# Patient Record
Sex: Male | Born: 1975 | Race: White | Hispanic: No | Smoking: Former smoker
Health system: Southern US, Community
[De-identification: ages and names within clinical notes are randomized; demographics above are authoritative.]

## PROBLEM LIST (undated history)

## (undated) DIAGNOSIS — M5430 Sciatica, unspecified side: Secondary | ICD-10-CM

## (undated) DIAGNOSIS — I10 Essential (primary) hypertension: Secondary | ICD-10-CM

## (undated) DIAGNOSIS — S0990XA Unspecified injury of head, initial encounter: Secondary | ICD-10-CM

## (undated) DIAGNOSIS — R569 Unspecified convulsions: Secondary | ICD-10-CM

## (undated) DIAGNOSIS — F4310 Post-traumatic stress disorder, unspecified: Secondary | ICD-10-CM

## (undated) DIAGNOSIS — G8929 Other chronic pain: Secondary | ICD-10-CM

## (undated) DIAGNOSIS — M549 Dorsalgia, unspecified: Secondary | ICD-10-CM

## (undated) DIAGNOSIS — J9811 Atelectasis: Secondary | ICD-10-CM

## (undated) DIAGNOSIS — F319 Bipolar disorder, unspecified: Secondary | ICD-10-CM

## (undated) DIAGNOSIS — R748 Abnormal levels of other serum enzymes: Secondary | ICD-10-CM

## (undated) DIAGNOSIS — F419 Anxiety disorder, unspecified: Secondary | ICD-10-CM

## (undated) HISTORY — PX: CHEST TUBE INSERTION: SHX231

## (undated) HISTORY — PX: KNEE SURGERY: SHX244

## (undated) HISTORY — PX: OTHER SURGICAL HISTORY: SHX169

## (undated) HISTORY — PX: BRAIN SURGERY: SHX531

---

## 1999-05-16 ENCOUNTER — Emergency Department (HOSPITAL_COMMUNITY): Admission: EM | Admit: 1999-05-16 | Discharge: 1999-05-16 | Payer: Self-pay | Admitting: Emergency Medicine

## 1999-11-16 ENCOUNTER — Emergency Department (HOSPITAL_COMMUNITY): Admission: EM | Admit: 1999-11-16 | Discharge: 1999-11-16 | Payer: Self-pay

## 2000-07-11 ENCOUNTER — Encounter: Admission: RE | Admit: 2000-07-11 | Discharge: 2000-10-06 | Payer: Self-pay | Admitting: Anesthesiology

## 2000-10-04 ENCOUNTER — Ambulatory Visit (HOSPITAL_COMMUNITY): Admission: RE | Admit: 2000-10-04 | Discharge: 2000-10-04 | Payer: Self-pay | Admitting: Neurosurgery

## 2000-10-04 ENCOUNTER — Encounter: Payer: Self-pay | Admitting: Neurosurgery

## 2000-11-15 ENCOUNTER — Encounter: Payer: Self-pay | Admitting: Emergency Medicine

## 2000-11-15 ENCOUNTER — Emergency Department (HOSPITAL_COMMUNITY): Admission: EM | Admit: 2000-11-15 | Discharge: 2000-11-15 | Payer: Self-pay | Admitting: Emergency Medicine

## 2001-11-02 ENCOUNTER — Emergency Department (HOSPITAL_COMMUNITY): Admission: EM | Admit: 2001-11-02 | Discharge: 2001-11-02 | Payer: Self-pay | Admitting: Emergency Medicine

## 2001-11-02 ENCOUNTER — Encounter: Payer: Self-pay | Admitting: Emergency Medicine

## 2003-07-19 ENCOUNTER — Inpatient Hospital Stay (HOSPITAL_COMMUNITY): Admission: EM | Admit: 2003-07-19 | Discharge: 2003-07-20 | Payer: Self-pay | Admitting: Emergency Medicine

## 2006-11-18 ENCOUNTER — Encounter: Payer: Self-pay | Admitting: Emergency Medicine

## 2006-11-18 ENCOUNTER — Inpatient Hospital Stay (HOSPITAL_COMMUNITY): Admission: EM | Admit: 2006-11-18 | Discharge: 2006-11-22 | Payer: Self-pay | Admitting: Emergency Medicine

## 2009-04-16 ENCOUNTER — Ambulatory Visit: Payer: Self-pay | Admitting: Thoracic Surgery

## 2009-04-16 ENCOUNTER — Inpatient Hospital Stay (HOSPITAL_COMMUNITY): Admission: EM | Admit: 2009-04-16 | Discharge: 2009-04-26 | Payer: Self-pay | Admitting: Thoracic Surgery

## 2009-04-16 ENCOUNTER — Ambulatory Visit: Payer: Self-pay | Admitting: Cardiology

## 2009-04-17 ENCOUNTER — Encounter: Payer: Self-pay | Admitting: Thoracic Surgery

## 2009-04-19 ENCOUNTER — Encounter: Payer: Self-pay | Admitting: Thoracic Surgery

## 2009-05-05 ENCOUNTER — Ambulatory Visit: Payer: Self-pay | Admitting: Thoracic Surgery

## 2009-06-23 ENCOUNTER — Ambulatory Visit: Payer: Self-pay | Admitting: Thoracic Surgery

## 2009-06-23 ENCOUNTER — Encounter: Admission: RE | Admit: 2009-06-23 | Discharge: 2009-06-23 | Payer: Self-pay | Admitting: Thoracic Surgery

## 2009-09-20 ENCOUNTER — Emergency Department (HOSPITAL_COMMUNITY): Admission: EM | Admit: 2009-09-20 | Discharge: 2009-09-20 | Payer: Self-pay | Admitting: Emergency Medicine

## 2009-09-28 ENCOUNTER — Ambulatory Visit: Payer: Self-pay | Admitting: Thoracic Surgery

## 2009-09-28 ENCOUNTER — Encounter: Admission: RE | Admit: 2009-09-28 | Discharge: 2009-09-28 | Payer: Self-pay | Admitting: Thoracic Surgery

## 2009-11-17 ENCOUNTER — Ambulatory Visit: Payer: Self-pay | Admitting: Thoracic Surgery

## 2010-02-27 ENCOUNTER — Encounter: Payer: Self-pay | Admitting: Thoracic Surgery

## 2010-05-01 LAB — URINALYSIS, ROUTINE W REFLEX MICROSCOPIC
Hgb urine dipstick: NEGATIVE
Nitrite: NEGATIVE
Protein, ur: NEGATIVE mg/dL
Specific Gravity, Urine: 1.011 (ref 1.005–1.030)
Urobilinogen, UA: 1 mg/dL (ref 0.0–1.0)

## 2010-05-01 LAB — COMPREHENSIVE METABOLIC PANEL
ALT: 21 U/L (ref 0–53)
ALT: 95 U/L — ABNORMAL HIGH (ref 0–53)
AST: 13 U/L (ref 0–37)
Albumin: 3.2 g/dL — ABNORMAL LOW (ref 3.5–5.2)
Alkaline Phosphatase: 102 U/L (ref 39–117)
Alkaline Phosphatase: 197 U/L — ABNORMAL HIGH (ref 39–117)
BUN: 3 mg/dL — ABNORMAL LOW (ref 6–23)
CO2: 27 mEq/L (ref 19–32)
CO2: 29 mEq/L (ref 19–32)
Calcium: 7.9 mg/dL — ABNORMAL LOW (ref 8.4–10.5)
Calcium: 8.8 mg/dL (ref 8.4–10.5)
Chloride: 100 mEq/L (ref 96–112)
Chloride: 101 mEq/L (ref 96–112)
Chloride: 102 mEq/L (ref 96–112)
Creatinine, Ser: 0.59 mg/dL (ref 0.4–1.5)
GFR calc non Af Amer: >60 mL/min (ref >60)
GFR calc non Af Amer: >60 mL/min (ref >60)
Glucose, Bld: 116 mg/dL — ABNORMAL HIGH (ref 70–99)
Glucose, Bld: 93 mg/dL (ref 70–99)
Potassium: 4.2 mEq/L (ref 3.5–5.1)
Sodium: 133 mEq/L — ABNORMAL LOW (ref 135–145)
Sodium: 136 mEq/L (ref 135–145)
Total Bilirubin: 0.5 mg/dL (ref 0.3–1.2)
Total Bilirubin: 0.5 mg/dL (ref 0.3–1.2)
Total Protein: 5.8 g/dL — ABNORMAL LOW (ref 6.0–8.3)

## 2010-05-01 LAB — CULTURE, BLOOD (ROUTINE X 2): Culture: NO GROWTH

## 2010-05-01 LAB — BASIC METABOLIC PANEL
BUN: 3 mg/dL — ABNORMAL LOW (ref 6–23)
CO2: 27 mEq/L (ref 19–32)
CO2: 28 mEq/L (ref 19–32)
Calcium: 7.9 mg/dL — ABNORMAL LOW (ref 8.4–10.5)
Calcium: 8.3 mg/dL — ABNORMAL LOW (ref 8.4–10.5)
Creatinine, Ser: 0.53 mg/dL (ref 0.4–1.5)
GFR calc Af Amer: >60 mL/min (ref >60)
GFR calc Af Amer: >60 mL/min (ref >60)
Glucose, Bld: 142 mg/dL — ABNORMAL HIGH (ref 70–99)
Potassium: 3.4 mEq/L — ABNORMAL LOW (ref 3.5–5.1)
Sodium: 133 mEq/L — ABNORMAL LOW (ref 135–145)

## 2010-05-01 LAB — FUNGUS CULTURE W SMEAR: Fungal Smear: NONE SEEN

## 2010-05-01 LAB — CULTURE, RESPIRATORY W GRAM STAIN: Culture: NORMAL

## 2010-05-01 LAB — CBC
HCT: 28.1 % — ABNORMAL LOW (ref 39.0–52.0)
Hemoglobin: 10 g/dL — ABNORMAL LOW (ref 13.0–17.0)
MCHC: 33.1 g/dL (ref 30.0–36.0)
MCHC: 33.2 g/dL (ref 30.0–36.0)
MCHC: 33.3 g/dL (ref 30.0–36.0)
MCHC: 33.5 g/dL (ref 30.0–36.0)
MCV: 84.2 fL (ref 78.0–100.0)
MCV: 84.6 fL (ref 78.0–100.0)
MCV: 84.7 fL (ref 78.0–100.0)
MCV: 85.8 fL (ref 78.0–100.0)
Platelets: 212 10*3/uL (ref 150–400)
Platelets: 238 10*3/uL (ref 150–400)
RBC: 3.32 MIL/uL — ABNORMAL LOW (ref 4.22–5.81)
RBC: 3.58 MIL/uL — ABNORMAL LOW (ref 4.22–5.81)
RBC: 3.96 MIL/uL — ABNORMAL LOW (ref 4.22–5.81)
RBC: 4 MIL/uL — ABNORMAL LOW (ref 4.22–5.81)
RBC: 4.38 MIL/uL (ref 4.22–5.81)
RBC: 4.45 MIL/uL (ref 4.22–5.81)
RDW: 14.4 % (ref 11.5–15.5)
WBC: 10.1 10*3/uL (ref 4.0–10.5)
WBC: 13.9 10*3/uL — ABNORMAL HIGH (ref 4.0–10.5)
WBC: 14 10*3/uL — ABNORMAL HIGH (ref 4.0–10.5)
WBC: 16.4 10*3/uL — ABNORMAL HIGH (ref 4.0–10.5)

## 2010-05-01 LAB — BLOOD GAS, ARTERIAL
O2 Content: 2 L/min
Patient temperature: 98.6
pO2, Arterial: 72.2 mmHg — ABNORMAL LOW (ref 80.0–100.0)

## 2010-05-01 LAB — DIFFERENTIAL
Basophils Relative: 0 % (ref 0–1)
Eosinophils Absolute: 0.1 10*3/uL (ref 0.0–0.7)
Lymphocytes Relative: 6 % — ABNORMAL LOW (ref 12–46)
Monocytes Relative: 9 % (ref 3–12)
Neutro Abs: 15.2 10*3/uL — ABNORMAL HIGH (ref 1.7–7.7)
Neutrophils Relative %: 84 % — ABNORMAL HIGH (ref 43–77)

## 2010-05-01 LAB — AFB CULTURE WITH SMEAR (NOT AT ARMC): Acid Fast Smear: NONE SEEN

## 2010-05-01 LAB — TISSUE CULTURE: Culture: NO GROWTH

## 2010-05-01 LAB — ABO/RH: ABO/RH(D): O POS

## 2010-05-01 LAB — EXPECTORATED SPUTUM ASSESSMENT W GRAM STAIN, RFLX TO RESP C

## 2010-05-01 LAB — CROSSMATCH
ABO/RH(D): O POS
Antibody Screen: NEGATIVE

## 2010-05-01 LAB — APTT: aPTT: 41 seconds — ABNORMAL HIGH (ref 24–37)

## 2010-05-01 LAB — MRSA PCR SCREENING: MRSA by PCR: NEGATIVE

## 2010-05-01 LAB — PROTIME-INR
INR: 1.32 (ref 0.00–1.49)
Prothrombin Time: 16.3 seconds — ABNORMAL HIGH (ref 11.6–15.2)

## 2010-06-21 NOTE — Op Note (Signed)
NAME:  Justin [name], Justin [name]                ACCOUNT NO.:  [id number]   MEDICAL RECORD NO.:  [id number]          PATIENT TYPE:  INP   LOCATION:  3111                         FACILITY:  [facility]   PHYSICIAN:  Payton Doughty, M.D.      DATE OF BIRTH:  [birth year]   DATE OF PROCEDURE:  11/18/2006  DATE OF DISCHARGE:                               OPERATIVE REPORT   PREOPERATIVE DIAGNOSIS:  Epidural hematoma.   POSTOPERATIVE DIAGNOSIS:  Epidural hematoma.   OPERATIVE PROCEDURE:  Left temporal craniectomy for epidural hematoma  evacuation.   SURGEON:  Payton Doughty, M.D.   ANESTHESIA:  General endotracheal.   PREP:  Sterile Betadine prep and scrub with alcohol wipe.   COMPLICATIONS:  None.   ASSISTANT:  None.   BODY OF TEXT:  This is a 35 year old gentleman with left temporal  epidural.  Taken to operative smoothly anesthetized, intubated, placed  supine on the operating table with the left shoulder bumped up.  The  head turned towards the right on a donut.  Following shave, prep, draped  in usual sterile fashion, skin was infiltrated with 1% lidocaine with  1:400,000 epinephrine.  The skin was incised starting just at the top of  the zygoma posteriorly near the ear, straight superiorly.  Shallow  incision was made and the fascia was carefully dissected spreading it to  not cut across it to preserve the branches of the seventh nerve should  they be there.  The temporalis fascia was carefully identified and  incised the Bovie was used to cut down to the skull and then out onto  the zygoma anteriorly.  Incision was swept up over the temporalis to  allow more anterior access to where the hematoma was.  The Weitlaner  retractor were placed.  A single bur hole was created and it was  enlarged using the Leksell rongeurs and then a small approximately 2 x 2  cm bone flap was turned with the craniotome.  The total size of the  craniectomy was probably 3 x 4 cm.  This allowed evacuation of the  hematoma  and access down to where two bleeding points were discovered  and bipolared.  To prevent reaccumulation, no bone was replaced.  A JP  drain was placed in the epidural space and exited via separate  incision..  Successive layers of 2-0 Vicryl, 3-0 nylon were used to  close the temporalis fascia galea and skin respectively.  Betadine Telfa  dressing was applied, made occlusive with OpSite and the patient  returned recovery room in good condition.           ______________________________  Payton Doughty, M.D.     MWR/MEDQ  D:  11/18/2006  T:  11/19/2006  Job:  [id number]

## 2010-06-21 NOTE — Assessment & Plan Note (Signed)
OFFICE VISIT   [place], [place] O  DOB:  [birth year]                                        Jun 23, 2009  CHART #:  [id number]   He returns today.  His chest x-ray continues to improve with improved  aeration.  There is no recurrence of either his pneumothorax or his  empyema.  He is still having some moderate chest wall pain but hopefully  this will improve.  He asked for [place] to five him a refill for his  antianxiety medication, but I told him to check with his psychiatrist on  that.  I will see him back again in 2 months for final check with a  chest x-ray.   Ines Bloomer, M.D.  Electronically Signed   DPB/MEDQ  D:  06/23/2009  T:  06/24/2009  Job:  [id number]
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
## 2010-06-21 NOTE — Letter (Signed)
May 05, 2009   Terrilee Files, MD  [address]  [place], [place]  [address]   Re:  [place], [place] O                DOB:  1975/05/09   Dear Dr. Georgiana Shore:   The patient returned today.  His blood pressure was 121/79, pulse 72,  respirations 18, and sats were 99%.  His chest x-ray that was sent with  him showed some mild blunting in the left costophrenic angle, but  overall he is doing well.  The patient had an empyema that probably was  secondary to a ruptured lung abscess and could cause his pneumothorax.  We did a VATS drainage and decortication of this and he has done well  since then.  He has finished his antibiotics.  I will see him back again  in 6 weeks with a chest x-ray.   Ines Bloomer, M.D.  Electronically Signed   DPB/MEDQ  D:  05/05/2009  T:  05/06/2009  Job:  [id number]
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
## 2010-06-21 NOTE — Assessment & Plan Note (Signed)
OFFICE VISIT   [place], [place] O  DOB:  1975-10-13                                        September 28, 2009  CHART #:  [id number]   The patient came today, and his chest x-ray is stable with some  parenchymal changes.  He still has some mild anterior chest wall pain in  the morning on awakening.  His blood pressure is 138/85, pulse 70,  respirations 18, sats are 99%.  Lungs are clear to auscultation and  percussion.  I will release him back to full activity and will have to  see him again if he has any future problems.   Ines Bloomer, M.D.  Electronically Signed   DPB/MEDQ  D:  09/28/2009  T:  09/29/2009  Job:  [id number]

## 2010-06-24 NOTE — H&P (Signed)
[facility]  Patient:    [name], [name]                       MRN: [id number] Adm. Date:  [id number] Attending:  Thyra Breed CC:         Tanya Nones. Jeral Fruit, M.D.   History and Physical  FOLLOWUP EVALUATION:  Justin comes in for followup evaluation of his low back pain radiating out into his left lower extremity.  Since his last evaluation, he has noted that going up on OxyContin to 40 mg twice a day has taken the edge off of his pain and he rates it at 4/10.  He is requiring about two Percocet per day while he is on this.  He has not been back to see Dr. Tanya Nones. Botero, as apparently he was advised that he did not get his Cobra in quick enough.  He has an attorney who is looking to try and get all this on workers compensation.  He denies any new neurologic symptoms.  He does note that the medications are helping but walking, bending, sitting or lifting will increase his pain.  It continues to radiate out into the left lower extremity.  CURRENT MEDICATIONS: 1. OxyContin 40 mg one twice a day. 2. Percocet 5/325 mg one to two p.o. q.6h. p.r.n.  EXAMINATION:  VITAL SIGNS:  Blood pressure 131/99.  Heart rate is 120.  Respiratory rate is 16.  O2 saturation is 100%.  Pain level is 4/10.  NEUROLOGIC:  Straight leg raise signs are positive on the left side at 40 degrees.  Deep tendon reflexes were symmetric at the knees and ankles.  IMPRESSION:  Lumbar degenerative disk disease with herniated disk at L5-L6.  DISPOSITION: 1. Continue on OxyContin 40 mg one p.o. b.i.d. 2. Percocet 5/325 mg 1 p.o. q.6h. p.r.n., #60. 3. Continue with exercises from ______ book. 4. The patient was encouraged to go ahead and schedule in with Dr. Jeral Fruit. DD:  08/23/00 TD:  08/23/00 Job: [id number]
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
## 2010-06-24 NOTE — H&P (Signed)
[facility]  Patient:    [name], [name]                       MRN: [id number] Adm. Date:  [id number] Attending:  Thyra Breed CC:         Tanya Nones. Jeral Fruit, M.D.   History and Physical  FOLLOWUP EVALUATION:  Justin comes in for followup evaluation of his low back pain on the basis of lumbar degenerative disk disease with herniation at L5 transitional vertebra level.  He has received two lumbar epidural steroid injections and has not noted a significant reduction in his pain.  I feel that he is likely unresponsive to epidural steroid injections.  He has noted that he is developing a cold and coughing more and as a result, he is having increasing back discomfort radiating out into the left lower extremity to the lateral aspect of his left foot.  He is noting that the OxyContin 20 mg twice a day is just not quite holding him as long as it should and he is taking about four tablets of Percocet per day to try and supplement this.  He is taking a laxative.  He is not actively in physical therapy and he was told by Dr. Tanya Nones. Botero to hold off on that for the time-being.  I have encouraged him to try and get a little increased level of activity, just so he does not get so severely deconditioned and I have given him a copy of a book list of ______ publications.  PHYSICAL EXAMINATION:  VITAL SIGNS:  Blood pressure 124/70.  Heart rate 67.  Respiratory rate is 18. O2 saturation is 100%.  Pain level is 6/10.  BACK:  The patient exhibited positive straight leg raise sign on the left side at 30 degrees.  EXTREMITIES:  Deep tendon reflexes were symmetric at the knee and ankles.  IMPRESSION:  Lumbar degenerative disk disease with herniated disk at L5-L6.  DISPOSITION: 1. Will increase OxyContin to 20 mg 1 p.o. q.8h., #90 with no refill. 2. Percocet 1 to 2 p.o. q.6h. p.r.n., #60, for breakthrough pain only. 3. He was given a list of ______ books and  encouraged to consider starting    some light exercises just to keep his back conditioned. 4. He plans to follow up with Dr. Jeral Fruit to see whether there are any surgical    options.  I plan to see him back in followup in four weeks if he is not a    surgical candidate. DD:  07/26/00 TD:  07/26/00 Job: [id number]

## 2010-06-24 NOTE — H&P (Signed)
[facility]  Patient:    Justin [name], Justin [name]                       MRN: [id number] Adm. Date:  [id number] Attending:  Thyra Breed CC:         Tanya Nones. Jeral Fruit, M.D.   History and Physical  FOLLOW-UP EVALUATION:  Justin comes in for follow-up evaluation of his chronic low back pain with underlying lumbar herniated disk. Since his last evaluation, he has been under a lot of stress. He has been evicted from his apartment, lost his motor vehicle, and is now living with his mother with his current wife. Apparently, there is a considerable amount of stress there. He continues to have pain and does feel overall improvement with the OxyContin but feels like it only lasts about 8 hours. He has to take at least four Percocet per day. He notes that sitting, standing, walking, and bending for prolonged periods of time will increase his discomfort. He is improved with physical therapy at home and the medicine partially.  PHYSICAL EXAMINATION:  VITAL SIGNS:  Blood pressure is 130/84, heart rate is 132, respiratory rate is 16, O2 saturation is 100%, pain level is 7/10.  NEUROLOGICAL:  The patient is anxious. Deep tendon reflexes were symmetric in the lower extremities with positive straight leg raise sign on the left side at 60 degrees. He has no change in his sensory exam from previously.  IMPRESSION: 1. Low back pain syndrome with known underlying lumbar herniated disk with    onset of discomfort dated from work-related injury. 2. Anxiety.  DISPOSITION: 1. Continue OxyContin 40 mg one p.[name]. b.i.d. for the time being. We will go    ahead and see if he qualifies for [employer] patient assistance    program. If he does, we will go ahead and increase this to 40 mg one p.[name].    q.8h. I have written a prescription for 90 tablets of this to be forwarded    to them. 2. Xanax 1 mg one p.[name]. q.8h. p.r.n., #90 with no refill. 3. Follow up with me in four weeks. 4. The  patient plans to see Tanya Nones. Jeral Fruit, M.D. in the interim. DD:  09/14/00 TD:  09/15/00 Job: [id number]

## 2010-06-24 NOTE — Op Note (Signed)
NAME:  Justin [name], Justin [name]                          ACCOUNT NO.:  [id number]   MEDICAL RECORD NO.:  [id number]                   PATIENT TYPE:  INP   LOCATION:  0154                                 FACILITY:  [facility]   PHYSICIAN:  Georgiana Spinner, M.D.                 DATE OF BIRTH:  [birth year]   DATE OF PROCEDURE:  07/19/2003  DATE OF DISCHARGE:                                 OPERATIVE REPORT   PROCEDURE:  Upper endoscopy.   INDICATIONS:  Evaluation of toxic ingestion.   ANESTHESIA:  1. Demerol 100 mg.  2. Versed 10 mg.   DESCRIPTION OF PROCEDURE:  With patient mildly sedated in the left lateral  decubitus position, the Olympus videoscopic endoscope was inserted in the  mouth, passed under direct vision through the esophagus, which appeared  normal.  We entered into the stomach and immediately noted that the stomach  was quite edematous, erythematous, and multiple erosions and ulcerations  with black excrescences were seen in the stomach.  We were able to advance  through the pylorus into the duodenal bulb which appeared normal as did  second portion of duodenum.  From this point, the endoscope was then slowly  withdrawn, taking circumferential views of duodenal mucosa until the  endoscope then pulled back into the stomach, placed in retroflexion to view  the stomach from below.  The endoscope was then straightened and withdrawn,  taking circumferential views of the remaining gastric and esophageal mucosa.  We pulled out through the pharynx and got a look at the vocal cords and the  arytenoid area, and all of this appeared normal.  The patient's vital signs  and pulse oximeter remained stable.  The patient tolerated the procedure  well without apparent complications.   FINDINGS:  Hemorrhagic gastritis moderately to markedly severe.  Otherwise,  unremarkable with sparing of esophagus, duodenum, and hypopharynx.   PLAN:  Will start patient on clear liquids, start proton pump inhibitor  IV,  and follow up clinical course.                                               Georgiana Spinner, M.D.    GMO/MEDQ  D:  07/19/2003  T:  07/19/2003  Job:  [id number]

## 2010-06-24 NOTE — Procedures (Signed)
[facility]  Patient:    Justin [name]                       MRN: [id number] Proc. Date: 07/19/00 Adm. Date:  [id number] Attending:  Thyra Breed CC:         Tanya Nones. Jeral Fruit, M.D.   Procedure Report  PROCEDURE:  Lumbar epidural steroid injection.  DIAGNOSIS:  Degenerative disk disease with herniation at L5 transitional vertebra level.  INTERVAL HISTORY:  The patient has noted minimal improvement after the first injection.  He is noting that the OxyContin is not quite holding him as long as he had hoped.  He continues with the OxyContin 20 mg twice a day plus the Percocet for rescue pain.  PHYSICAL EXAMINATION:  Blood pressure 137/59, heart rate 64, respiratory rate 16, O2 saturations 99%.  Pain level is 6/10.  His back shows good healing from his previous injection site.  DESCRIPTION OF PROCEDURE:  After informed consent, the patient was placed in the sitting position and monitored.  His back was prepped with Betadine x 3. A skin wheal was raised at the L4-L5 interspace with 1% lidocaine.  A 20 gauge Tuohy needle was introduced to the lumbar epidural space to loss of resistance to preservative-free normal saline.  There was no CSF nor blood.  The depth was 4 cm.  Medrol 80 mg in 8 mL of preservative-free normal saline was gently injected.  The needle was flushed with preservative-free normal saline and removed intact.  Postprocedure condition - stable.  DISCHARGE INSTRUCTIONS: 1. Resume previous diet. 2. Limitations and activities per instruction sheet. 3. Continue on current medications. 4. Follow up with me in 1-2 weeks for either repeat injection or adjustments    in his medications. DD:  07/19/00 TD:  07/19/00 Job: [id number]

## 2010-11-17 LAB — COMPREHENSIVE METABOLIC PANEL
BUN: 14
CO2: 25
Chloride: 101
Creatinine, Ser: 0.96
GFR calc non Af Amer: >60
Total Bilirubin: 1.3 — ABNORMAL HIGH

## 2010-11-17 LAB — I-STAT EC8
Acid-base deficit: 3 — ABNORMAL HIGH
BUN: 11
Chloride: 104
Glucose, Bld: 89
Potassium: 3.9
pCO2 arterial: 36.6
pH, Arterial: 7.382

## 2010-11-17 LAB — BASIC METABOLIC PANEL
Calcium: 8.2 — ABNORMAL LOW
Chloride: 101
Creatinine, Ser: 0.84
GFR calc Af Amer: >60
Sodium: 132 — ABNORMAL LOW

## 2010-11-17 LAB — CBC
HCT: 37.3 — ABNORMAL LOW
Hemoglobin: 11.9 — ABNORMAL LOW
MCV: 82.4
MCV: 84.5
Platelets: 231
RBC: 4.53
RBC: 4.27
WBC: 14 — ABNORMAL HIGH
WBC: 12.2 — ABNORMAL HIGH

## 2010-11-17 LAB — DIFFERENTIAL
Basophils Relative: 1
Eosinophils Absolute: 0.1
Eosinophils Relative: 1
Lymphocytes Relative: 22
Monocytes Absolute: 1.1 — ABNORMAL HIGH
Neutro Abs: 9.5 — ABNORMAL HIGH
Neutrophils Relative %: 68

## 2010-11-17 LAB — SAMPLE TO BLOOD BANK

## 2011-01-23 DIAGNOSIS — Z0279 Encounter for issue of other medical certificate: Secondary | ICD-10-CM

## 2011-02-03 ENCOUNTER — Emergency Department (HOSPITAL_COMMUNITY)
Admission: EM | Admit: 2011-02-03 | Discharge: 2011-02-04 | Disposition: A | Discharge status: Home / Self Care | Payer: No Typology Code available for payment source | Attending: Emergency Medicine | Admitting: Emergency Medicine

## 2011-02-03 ENCOUNTER — Emergency Department (HOSPITAL_COMMUNITY): Payer: Self-pay

## 2011-02-03 ENCOUNTER — Encounter: Payer: Self-pay | Admitting: *Deleted

## 2011-02-03 DIAGNOSIS — S0003XA Contusion of scalp, initial encounter: Secondary | ICD-10-CM | POA: Insufficient documentation

## 2011-02-03 DIAGNOSIS — S0180XA Unspecified open wound of other part of head, initial encounter: Secondary | ICD-10-CM | POA: Insufficient documentation

## 2011-02-03 DIAGNOSIS — S21219A Laceration without foreign body of unspecified back wall of thorax without penetration into thoracic cavity, initial encounter: Secondary | ICD-10-CM

## 2011-02-03 DIAGNOSIS — M25559 Pain in unspecified hip: Secondary | ICD-10-CM | POA: Insufficient documentation

## 2011-02-03 DIAGNOSIS — T07XXXA Unspecified multiple injuries, initial encounter: Secondary | ICD-10-CM

## 2011-02-03 DIAGNOSIS — S0181XA Laceration without foreign body of other part of head, initial encounter: Secondary | ICD-10-CM

## 2011-02-03 DIAGNOSIS — M545 Low back pain, unspecified: Secondary | ICD-10-CM | POA: Insufficient documentation

## 2011-02-03 DIAGNOSIS — R51 Headache: Secondary | ICD-10-CM | POA: Insufficient documentation

## 2011-02-03 DIAGNOSIS — S21209A Unspecified open wound of unspecified back wall of thorax without penetration into thoracic cavity, initial encounter: Secondary | ICD-10-CM | POA: Insufficient documentation

## 2011-02-03 DIAGNOSIS — Z79899 Other long term (current) drug therapy: Secondary | ICD-10-CM | POA: Insufficient documentation

## 2011-02-03 DIAGNOSIS — S41009A Unspecified open wound of unspecified shoulder, initial encounter: Secondary | ICD-10-CM | POA: Insufficient documentation

## 2011-02-03 DIAGNOSIS — S0083XA Contusion of other part of head, initial encounter: Secondary | ICD-10-CM | POA: Insufficient documentation

## 2011-02-03 DIAGNOSIS — S01119A Laceration without foreign body of unspecified eyelid and periocular area, initial encounter: Secondary | ICD-10-CM | POA: Insufficient documentation

## 2011-02-03 DIAGNOSIS — F411 Generalized anxiety disorder: Secondary | ICD-10-CM | POA: Insufficient documentation

## 2011-02-03 DIAGNOSIS — IMO0002 Reserved for concepts with insufficient information to code with codable children: Secondary | ICD-10-CM | POA: Insufficient documentation

## 2011-02-03 HISTORY — DX: Unspecified convulsions: R56.9

## 2011-02-03 HISTORY — DX: Dorsalgia, unspecified: M54.9

## 2011-02-03 HISTORY — DX: Other chronic pain: G89.29

## 2011-02-03 HISTORY — DX: Anxiety disorder, unspecified: F41.9

## 2011-02-03 LAB — POCT I-STAT, CHEM 8
BUN: 9 mg/dL (ref 6–23)
Creatinine, Ser: 1 mg/dL (ref 0.50–1.35)
Glucose, Bld: 121 mg/dL — ABNORMAL HIGH (ref 70–99)
Potassium: 3.8 mEq/L (ref 3.5–5.1)
Sodium: 137 mEq/L (ref 135–145)

## 2011-02-03 MED ORDER — HYDROMORPHONE HCL PF 1 MG/ML IJ SOLN
1.0000 mg | Freq: Once | INTRAMUSCULAR | Status: AC
  Administered 2011-02-03: 1 mg via INTRAVENOUS
  Filled 2011-02-03: qty 1

## 2011-02-03 MED ORDER — IOHEXOL 300 MG/ML  SOLN
100.0000 mL | Freq: Once | INTRAMUSCULAR | Status: AC | PRN
  Administered 2011-02-03: 100 mL via INTRAVENOUS

## 2011-02-03 MED ORDER — OXYCODONE-ACETAMINOPHEN 5-325 MG PO TABS
2.0000 | ORAL_TABLET | Freq: Once | ORAL | Status: AC
  Administered 2011-02-03: 2 via ORAL
  Filled 2011-02-03: qty 2

## 2011-02-03 MED ORDER — FENTANYL CITRATE 0.05 MG/ML IJ SOLN
INTRAMUSCULAR | Status: AC
  Administered 2011-02-03: 19:00:00
  Filled 2011-02-03: qty 2

## 2011-02-03 MED ORDER — TETANUS-DIPHTH-ACELL PERTUSSIS 5-2.5-18.5 LF-MCG/0.5 IM SUSP
0.5000 mL | Freq: Once | INTRAMUSCULAR | Status: AC
  Administered 2011-02-03: 0.5 mL via INTRAMUSCULAR
  Filled 2011-02-03: qty 0.5

## 2011-02-03 NOTE — ED Notes (Signed)
Hourly Rounding: Pt in CT at this time.

## 2011-02-03 NOTE — ED Notes (Signed)
Suture cart in room  

## 2011-02-03 NOTE — ED Notes (Signed)
Hourly Rounding: Pts pain addressed with pain medication. Pt sat up in bed, per EDP giving the okay. Water was given with pain medication. Family sitting with pt. Pt does not need the restroom at this time. Will continue to monitor.

## 2011-02-03 NOTE — ED Notes (Signed)
Hourly Rounding: Pt sleeping at this time. Family at bedside.
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
## 2011-02-03 NOTE — ED Notes (Signed)
Hourly Rounding: Pt sleeping at this time. Family at bedside. Will continue to monitor.
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
## 2011-02-04 MED ORDER — OXYCODONE HCL 5 MG PO TABS: 5.0000 mg | ORAL_TABLET | ORAL | Status: AC | PRN

## 2011-02-04 MED ORDER — HYDROMORPHONE HCL PF 1 MG/ML IJ SOLN
1.0000 mg | Freq: Once | INTRAMUSCULAR | Status: AC
  Administered 2011-02-04: 1 mg via INTRAVENOUS
  Filled 2011-02-04: qty 1

## 2011-02-04 NOTE — ED Provider Notes (Signed)
I saw and evaluated the patient, reviewed the resident's note and I agree with the findings and plan.   .Face to face Exam:  General:  Awake HEENT:  Facial laceration/abrasions Resp:  Normal effort Abd:  Nondistended Neuro:No focal weakness Lymph: No adenopathy   Nelia Shi, MD 02/04/11 1105

## 2011-02-04 NOTE — ED Notes (Signed)
Resident at bedside to suture.

## 2011-02-04 NOTE — ED Notes (Signed)
Pt d/c home with family member.  Rx x 1.  Pt voiced understanding to f/u with eye doctor tomorrow.

## 2011-04-05 ENCOUNTER — Other Ambulatory Visit: Payer: Self-pay

## 2011-04-05 ENCOUNTER — Emergency Department (HOSPITAL_COMMUNITY)
Admission: EM | Admit: 2011-04-05 | Discharge: 2011-04-05 | Disposition: A | Discharge status: Home / Self Care | Payer: Self-pay | Attending: Emergency Medicine | Admitting: Emergency Medicine

## 2011-04-05 ENCOUNTER — Encounter (HOSPITAL_COMMUNITY): Payer: Self-pay | Admitting: *Deleted

## 2011-04-05 ENCOUNTER — Emergency Department (HOSPITAL_COMMUNITY): Payer: Self-pay

## 2011-04-05 DIAGNOSIS — M549 Dorsalgia, unspecified: Secondary | ICD-10-CM | POA: Insufficient documentation

## 2011-04-05 DIAGNOSIS — F411 Generalized anxiety disorder: Secondary | ICD-10-CM | POA: Insufficient documentation

## 2011-04-05 DIAGNOSIS — R209 Unspecified disturbances of skin sensation: Secondary | ICD-10-CM | POA: Insufficient documentation

## 2011-04-05 DIAGNOSIS — R55 Syncope and collapse: Secondary | ICD-10-CM | POA: Insufficient documentation

## 2011-04-05 DIAGNOSIS — R569 Unspecified convulsions: Secondary | ICD-10-CM | POA: Insufficient documentation

## 2011-04-05 DIAGNOSIS — R7889 Finding of other specified substances, not normally found in blood: Secondary | ICD-10-CM

## 2011-04-05 DIAGNOSIS — R7989 Other specified abnormal findings of blood chemistry: Secondary | ICD-10-CM | POA: Insufficient documentation

## 2011-04-05 DIAGNOSIS — R51 Headache: Secondary | ICD-10-CM | POA: Insufficient documentation

## 2011-04-05 DIAGNOSIS — F17200 Nicotine dependence, unspecified, uncomplicated: Secondary | ICD-10-CM | POA: Insufficient documentation

## 2011-04-05 LAB — BASIC METABOLIC PANEL
BUN: 12 mg/dL (ref 6–23)
Calcium: 9 mg/dL (ref 8.4–10.5)
Creatinine, Ser: 0.92 mg/dL (ref 0.50–1.35)
GFR calc non Af Amer: >90 mL/min (ref >90)
Glucose, Bld: 105 mg/dL — ABNORMAL HIGH (ref 70–99)

## 2011-04-05 LAB — CBC
HCT: 39.2 % (ref 39.0–52.0)
Hemoglobin: 12.9 g/dL — ABNORMAL LOW (ref 13.0–17.0)
MCH: 27.6 pg (ref 26.0–34.0)
MCHC: 32.9 g/dL (ref 30.0–36.0)

## 2011-04-05 LAB — DIFFERENTIAL
Basophils Relative: 2 % — ABNORMAL HIGH (ref 0–1)
Eosinophils Absolute: 0.3 10*3/uL (ref 0.0–0.7)
Monocytes Absolute: 0.8 10*3/uL (ref 0.1–1.0)
Monocytes Relative: 11 % (ref 3–12)
Neutrophils Relative %: 41 % — ABNORMAL LOW (ref 43–77)

## 2011-04-05 MED ORDER — OXYCODONE-ACETAMINOPHEN 5-325 MG PO TABS: 1.0000 | ORAL_TABLET | ORAL | Status: AC | PRN

## 2011-04-05 MED ORDER — OXYCODONE-ACETAMINOPHEN 5-325 MG PO TABS
1.0000 | ORAL_TABLET | Freq: Once | ORAL | Status: AC
  Administered 2011-04-05: 1 via ORAL
  Filled 2011-04-05: qty 1

## 2011-04-05 MED ORDER — FOSPHENYTOIN SODIUM 500 MG PE/10ML IJ SOLN
INTRAMUSCULAR | Status: AC
  Filled 2011-04-05: qty 30

## 2011-04-05 MED ORDER — SODIUM CHLORIDE 0.9 % IV SOLN
1500.0000 mg | Freq: Once | INTRAVENOUS | Status: AC
  Administered 2011-04-05: 1500 mg via INTRAVENOUS
  Filled 2011-04-05: qty 30

## 2011-04-05 MED ORDER — PHENYTOIN SODIUM 50 MG/ML IJ SOLN: 1500.0000 mg | Freq: Once | INTRAMUSCULAR | Status: DC

## 2011-04-05 NOTE — ED Notes (Signed)
Pt c/o pain in his neck and back. Pt c/o numbness and tingling in his left leg. Also states that he has been having dizziness and memory loss off and on. States that he blacked out and fell today due to pain. Also c/o vision worsening over the last 30 days.
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
## 2011-04-05 NOTE — ED Notes (Signed)
Received report from d stovall rn. Pt taken to xray at this time.

## 2011-04-05 NOTE — Discharge Instructions (Signed)
Back Pain, Adult Low back pain is very common. About 1 in 5 people have back pain.The cause of low back pain is rarely dangerous. The pain often gets better over time.About half of people with a sudden onset of back pain feel better in just 2 weeks. About 8 in 10 people feel better by 6 weeks.  CAUSES Some common causes of back pain include:  Strain of the muscles or ligaments supporting the spine.   Wear and tear (degeneration) of the spinal discs.   Arthritis.   Direct injury to the back.  DIAGNOSIS Most of the time, the direct cause of low back pain is not known.However, back pain can be treated effectively even when the exact cause of the pain is unknown.Answering your caregiver's questions about your overall health and symptoms is one of the most accurate ways to make sure the cause of your pain is not dangerous. If your caregiver needs more information, he or she may order lab work or imaging tests (X-rays or MRIs).However, even if imaging tests show changes in your back, this usually does not require surgery. HOME CARE INSTRUCTIONS For many people, back pain returns.Since low back pain is rarely dangerous, it is often a condition that people can learn to manageon their own.   Remain active. It is stressful on the back to sit or stand in one place. Do not sit, drive, or stand in one place for more than 30 minutes at a time. Take short walks on level surfaces as soon as pain allows.Try to increase the length of time you walk each day.   Do not stay in bed.Resting more than 1 or 2 days can delay your recovery.   Do not avoid exercise or work.Your body is made to move.It is not dangerous to be active, even though your back may hurt.Your back will likely heal faster if you return to being active before your pain is gone.   Pay attention to your body when you bend and lift. Many people have less discomfortwhen lifting if they bend their knees, keep the load close to their  bodies,and avoid twisting. Often, the most comfortable positions are those that put less stress on your recovering back.   Find a comfortable position to sleep. Use a firm mattress and lie on your side with your knees slightly bent. If you lie on your back, put a pillow under your knees.   Only take over-the-counter or prescription medicines as directed by your caregiver. Over-the-counter medicines to reduce pain and inflammation are often the most helpful.Your caregiver may prescribe muscle relaxant drugs.These medicines help dull your pain so you can more quickly return to your normal activities and healthy exercise.   Put ice on the injured area.   Put ice in a plastic bag.   Place a towel between your skin and the bag.   Leave the ice on for 15 to 20 minutes, 3 to 4 times a day for the first 2 to 3 days. After that, ice and heat may be alternated to reduce pain and spasms.   Ask your caregiver about trying back exercises and gentle massage. This may be of some benefit.   Avoid feeling anxious or stressed.Stress increases muscle tension and can worsen back pain.It is important to recognize when you are anxious or stressed and learn ways to manage it.Exercise is a great option.  SEEK MEDICAL CARE IF:  You have pain that is not relieved with rest or medicine.   You have   pain that does not improve in 1 week.   You have new symptoms.   You are generally not feeling well.  SEEK IMMEDIATE MEDICAL CARE IF:   You have pain that radiates from your back into your legs.   You develop new bowel or bladder control problems.   You have unusual weakness or numbness in your arms or legs.   You develop nausea or vomiting.   You develop abdominal pain.   You feel faint.  Document Released: 01/23/2005 Document Revised: 10/05/2010 Document Reviewed: 06/13/2010 [employer] Patient Information 2012 [employer]. 

## 2011-09-16 ENCOUNTER — Encounter (HOSPITAL_COMMUNITY): Payer: Self-pay | Admitting: *Deleted

## 2011-09-16 ENCOUNTER — Emergency Department (HOSPITAL_COMMUNITY)
Admission: EM | Admit: 2011-09-16 | Discharge: 2011-09-16 | Disposition: A | Discharge status: Home / Self Care | Payer: Self-pay | Attending: Emergency Medicine | Admitting: Emergency Medicine

## 2011-09-16 DIAGNOSIS — K0889 Other specified disorders of teeth and supporting structures: Secondary | ICD-10-CM

## 2011-09-16 DIAGNOSIS — F17200 Nicotine dependence, unspecified, uncomplicated: Secondary | ICD-10-CM | POA: Insufficient documentation

## 2011-09-16 DIAGNOSIS — F411 Generalized anxiety disorder: Secondary | ICD-10-CM | POA: Insufficient documentation

## 2011-09-16 DIAGNOSIS — G8929 Other chronic pain: Secondary | ICD-10-CM | POA: Insufficient documentation

## 2011-09-16 DIAGNOSIS — G40909 Epilepsy, unspecified, not intractable, without status epilepticus: Secondary | ICD-10-CM | POA: Insufficient documentation

## 2011-09-16 DIAGNOSIS — K089 Disorder of teeth and supporting structures, unspecified: Secondary | ICD-10-CM | POA: Insufficient documentation

## 2011-09-16 MED ORDER — HYDROCODONE-ACETAMINOPHEN 5-325 MG PO TABS
1.0000 | ORAL_TABLET | Freq: Once | ORAL | Status: AC
  Administered 2011-09-16: 1 via ORAL
  Filled 2011-09-16: qty 1

## 2011-09-16 MED ORDER — HYDROCODONE-ACETAMINOPHEN 5-325 MG PO TABS: 1.0000 | ORAL_TABLET | Freq: Four times a day (QID) | ORAL | Status: DC | PRN

## 2011-09-16 MED ORDER — PENICILLIN V POTASSIUM 250 MG PO TABS
500.0000 mg | ORAL_TABLET | Freq: Once | ORAL | Status: AC
  Administered 2011-09-16: 500 mg via ORAL
  Filled 2011-09-16: qty 2

## 2011-09-16 MED ORDER — PENICILLIN V POTASSIUM 500 MG PO TABS: 500.0000 mg | ORAL_TABLET | Freq: Four times a day (QID) | ORAL | Status: AC

## 2011-09-16 NOTE — ED Notes (Signed)
Left side dental pain that started earlier in the week, worse over the past three days, pt states that he has a tooth where the filling came out, has dental appointment next week

## 2011-09-16 NOTE — ED Provider Notes (Signed)
Medical screening examination/treatment/procedure(s) were performed by non-physician practitioner and as supervising physician I was immediately available for consultation/collaboration.  Donnetta Hutching, MD 09/16/11 1520

## 2011-09-16 NOTE — ED Notes (Signed)
Pt c/o left sided dental pain that began about 1 week ago. Pt states pain has increased over last 3 days. Pt has appointment set up at dentist for Wednesday but states the pain is too much to wait.
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
## 2011-09-27 ENCOUNTER — Emergency Department (HOSPITAL_COMMUNITY)
Admission: EM | Admit: 2011-09-27 | Discharge: 2011-09-27 | Disposition: A | Discharge status: Home / Self Care | Payer: Self-pay | Attending: Emergency Medicine | Admitting: Emergency Medicine

## 2011-09-27 ENCOUNTER — Encounter (HOSPITAL_COMMUNITY): Payer: Self-pay | Admitting: *Deleted

## 2011-09-27 DIAGNOSIS — G8929 Other chronic pain: Secondary | ICD-10-CM | POA: Insufficient documentation

## 2011-09-27 DIAGNOSIS — K047 Periapical abscess without sinus: Secondary | ICD-10-CM | POA: Insufficient documentation

## 2011-09-27 DIAGNOSIS — F319 Bipolar disorder, unspecified: Secondary | ICD-10-CM | POA: Insufficient documentation

## 2011-09-27 DIAGNOSIS — F411 Generalized anxiety disorder: Secondary | ICD-10-CM | POA: Insufficient documentation

## 2011-09-27 DIAGNOSIS — R569 Unspecified convulsions: Secondary | ICD-10-CM | POA: Insufficient documentation

## 2011-09-27 DIAGNOSIS — F17200 Nicotine dependence, unspecified, uncomplicated: Secondary | ICD-10-CM | POA: Insufficient documentation

## 2011-09-27 DIAGNOSIS — M549 Dorsalgia, unspecified: Secondary | ICD-10-CM | POA: Insufficient documentation

## 2011-09-27 HISTORY — DX: Bipolar disorder, unspecified: F31.9

## 2011-09-27 MED ORDER — PENICILLIN V POTASSIUM 250 MG PO TABS
500.0000 mg | ORAL_TABLET | Freq: Once | ORAL | Status: AC
  Administered 2011-09-27: 500 mg via ORAL
  Filled 2011-09-27: qty 2

## 2011-09-27 MED ORDER — HYDROCODONE-ACETAMINOPHEN 5-325 MG PO TABS: 1.0000 | ORAL_TABLET | ORAL | Status: AC | PRN

## 2011-09-27 MED ORDER — HYDROCODONE-ACETAMINOPHEN 5-325 MG PO TABS
1.0000 | ORAL_TABLET | Freq: Once | ORAL | Status: AC
  Administered 2011-09-27: 1 via ORAL
  Filled 2011-09-27: qty 1

## 2011-09-27 MED ORDER — PENICILLIN V POTASSIUM 500 MG PO TABS: 500.0000 mg | ORAL_TABLET | Freq: Three times a day (TID) | ORAL | Status: AC
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
## 2011-09-27 NOTE — ED Notes (Signed)
Pt c/o left upper toothache and swelling to his jaw since last night. Pt has been using ice to his jaw and has no swelling at this time. Pt seen prior for same and was supposed to follow up with dentist but has not been able to afford to go.

## 2011-09-27 NOTE — ED Notes (Signed)
Pt presents with left toothache, pt continues to hold ice on face. No swelling noted. Pt alert talkative, with no distress noted. Pt states cannot afford to seek help for dental care.

## 2011-09-28 NOTE — ED Provider Notes (Signed)
Medical screening examination/treatment/procedure(s) were performed by non-physician practitioner and as supervising physician I was immediately available for consultation/collaboration.   Charles B. Bernette Mayers, MD 09/28/11 0700

## 2011-11-04 ENCOUNTER — Encounter (HOSPITAL_COMMUNITY): Payer: Self-pay | Admitting: *Deleted

## 2011-11-04 ENCOUNTER — Emergency Department (HOSPITAL_COMMUNITY)
Admission: EM | Admit: 2011-11-04 | Discharge: 2011-11-04 | Disposition: A | Discharge status: Home / Self Care | Payer: Self-pay | Attending: Emergency Medicine | Admitting: Emergency Medicine

## 2011-11-04 DIAGNOSIS — K047 Periapical abscess without sinus: Secondary | ICD-10-CM | POA: Insufficient documentation

## 2011-11-04 DIAGNOSIS — F411 Generalized anxiety disorder: Secondary | ICD-10-CM | POA: Insufficient documentation

## 2011-11-04 DIAGNOSIS — Z79899 Other long term (current) drug therapy: Secondary | ICD-10-CM | POA: Insufficient documentation

## 2011-11-04 DIAGNOSIS — G40909 Epilepsy, unspecified, not intractable, without status epilepticus: Secondary | ICD-10-CM | POA: Insufficient documentation

## 2011-11-04 DIAGNOSIS — F17200 Nicotine dependence, unspecified, uncomplicated: Secondary | ICD-10-CM | POA: Insufficient documentation

## 2011-11-04 DIAGNOSIS — G8929 Other chronic pain: Secondary | ICD-10-CM | POA: Insufficient documentation

## 2011-11-04 DIAGNOSIS — F319 Bipolar disorder, unspecified: Secondary | ICD-10-CM | POA: Insufficient documentation

## 2011-11-04 MED ORDER — HYDROCODONE-ACETAMINOPHEN 5-325 MG PO TABS: 1.0000 | ORAL_TABLET | ORAL | Status: AC | PRN

## 2011-11-04 MED ORDER — HYDROCODONE-ACETAMINOPHEN 5-325 MG PO TABS
1.0000 | ORAL_TABLET | Freq: Once | ORAL | Status: AC
  Administered 2011-11-04: 1 via ORAL
  Filled 2011-11-04: qty 1

## 2011-11-04 MED ORDER — PENICILLIN V POTASSIUM 250 MG PO TABS
500.0000 mg | ORAL_TABLET | Freq: Once | ORAL | Status: AC
  Administered 2011-11-04: 500 mg via ORAL
  Filled 2011-11-04: qty 2

## 2011-11-04 MED ORDER — AMOXICILLIN 500 MG PO CAPS: 500.0000 mg | ORAL_CAPSULE | Freq: Three times a day (TID) | ORAL | Status: DC

## 2011-11-04 NOTE — ED Provider Notes (Signed)
Medical screening examination/treatment/procedure(s) were performed by non-physician practitioner and as supervising physician I was immediately available for consultation/collaboration.   Issiah Huffaker L Emmakate Hypes, MD 11/04/11 2016 

## 2011-11-04 NOTE — ED Notes (Signed)
Dental pain. 
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
## 2011-12-15 ENCOUNTER — Encounter (HOSPITAL_COMMUNITY): Payer: Self-pay | Admitting: *Deleted

## 2011-12-15 ENCOUNTER — Emergency Department (HOSPITAL_COMMUNITY)
Admission: EM | Admit: 2011-12-15 | Discharge: 2011-12-15 | Disposition: A | Discharge status: Home / Self Care | Payer: No Typology Code available for payment source | Attending: Emergency Medicine | Admitting: Emergency Medicine

## 2011-12-15 DIAGNOSIS — F17200 Nicotine dependence, unspecified, uncomplicated: Secondary | ICD-10-CM | POA: Insufficient documentation

## 2011-12-15 DIAGNOSIS — I10 Essential (primary) hypertension: Secondary | ICD-10-CM | POA: Insufficient documentation

## 2011-12-15 DIAGNOSIS — F411 Generalized anxiety disorder: Secondary | ICD-10-CM | POA: Insufficient documentation

## 2011-12-15 DIAGNOSIS — F319 Bipolar disorder, unspecified: Secondary | ICD-10-CM | POA: Insufficient documentation

## 2011-12-15 DIAGNOSIS — Z791 Long term (current) use of non-steroidal anti-inflammatories (NSAID): Secondary | ICD-10-CM | POA: Insufficient documentation

## 2011-12-15 DIAGNOSIS — G40909 Epilepsy, unspecified, not intractable, without status epilepticus: Secondary | ICD-10-CM | POA: Insufficient documentation

## 2011-12-15 DIAGNOSIS — M549 Dorsalgia, unspecified: Secondary | ICD-10-CM | POA: Insufficient documentation

## 2011-12-15 DIAGNOSIS — K047 Periapical abscess without sinus: Secondary | ICD-10-CM

## 2011-12-15 HISTORY — DX: Essential (primary) hypertension: I10

## 2011-12-15 MED ORDER — AMOXICILLIN 500 MG PO CAPS: 500.0000 mg | ORAL_CAPSULE | Freq: Three times a day (TID) | ORAL | Status: AC

## 2011-12-15 MED ORDER — OXYCODONE-ACETAMINOPHEN 5-325 MG PO TABS: 1.0000 | ORAL_TABLET | ORAL | Status: AC | PRN
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
## 2011-12-18 NOTE — ED Provider Notes (Signed)
Medical screening examination/treatment/procedure(s) were performed by non-physician practitioner and as supervising physician I was immediately available for consultation/collaboration.   Dione Booze, MD 12/18/11 [phone]

## 2012-01-11 ENCOUNTER — Encounter (HOSPITAL_COMMUNITY): Payer: Self-pay | Admitting: Emergency Medicine

## 2012-01-11 ENCOUNTER — Emergency Department (HOSPITAL_COMMUNITY)
Admission: EM | Admit: 2012-01-11 | Discharge: 2012-01-11 | Disposition: A | Discharge status: Home / Self Care | Payer: Self-pay | Attending: Emergency Medicine | Admitting: Emergency Medicine

## 2012-01-11 DIAGNOSIS — Z79899 Other long term (current) drug therapy: Secondary | ICD-10-CM | POA: Insufficient documentation

## 2012-01-11 DIAGNOSIS — G8929 Other chronic pain: Secondary | ICD-10-CM | POA: Insufficient documentation

## 2012-01-11 DIAGNOSIS — F411 Generalized anxiety disorder: Secondary | ICD-10-CM | POA: Insufficient documentation

## 2012-01-11 DIAGNOSIS — I10 Essential (primary) hypertension: Secondary | ICD-10-CM | POA: Insufficient documentation

## 2012-01-11 DIAGNOSIS — M549 Dorsalgia, unspecified: Secondary | ICD-10-CM | POA: Insufficient documentation

## 2012-01-11 DIAGNOSIS — L02419 Cutaneous abscess of limb, unspecified: Secondary | ICD-10-CM

## 2012-01-11 DIAGNOSIS — F319 Bipolar disorder, unspecified: Secondary | ICD-10-CM | POA: Insufficient documentation

## 2012-01-11 DIAGNOSIS — F17200 Nicotine dependence, unspecified, uncomplicated: Secondary | ICD-10-CM | POA: Insufficient documentation

## 2012-01-11 DIAGNOSIS — R569 Unspecified convulsions: Secondary | ICD-10-CM | POA: Insufficient documentation

## 2012-01-11 DIAGNOSIS — IMO0002 Reserved for concepts with insufficient information to code with codable children: Secondary | ICD-10-CM | POA: Insufficient documentation

## 2012-01-11 MED ORDER — OXYCODONE HCL 5 MG PO TABS: 5.0000 mg | ORAL_TABLET | Freq: Four times a day (QID) | ORAL | Status: DC | PRN

## 2012-01-11 MED ORDER — IBUPROFEN 800 MG PO TABS
800.0000 mg | ORAL_TABLET | Freq: Once | ORAL | Status: AC
  Administered 2012-01-11: 800 mg via ORAL
  Filled 2012-01-11: qty 1

## 2012-01-11 MED ORDER — OXYCODONE-ACETAMINOPHEN 5-325 MG PO TABS
1.0000 | ORAL_TABLET | Freq: Once | ORAL | Status: AC
  Administered 2012-01-11: 1 via ORAL
  Filled 2012-01-11: qty 1

## 2012-01-11 MED ORDER — DOXYCYCLINE HYCLATE 100 MG PO CAPS: 100.0000 mg | ORAL_CAPSULE | Freq: Two times a day (BID) | ORAL | Status: DC

## 2012-01-11 NOTE — ED Notes (Signed)
Pt presents with abscess to right axillary area. Pt states he first noticed 1 day ago. Pt denies drainage from site.
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
## 2012-01-12 NOTE — ED Provider Notes (Signed)
Medical screening examination/treatment/procedure(s) were performed by non-physician practitioner and as supervising physician I was immediately available for consultation/collaboration.   Taesean Reth W. Julliana Whitmyer, MD 01/12/12 1629 

## 2012-03-10 ENCOUNTER — Encounter (HOSPITAL_COMMUNITY): Payer: Self-pay

## 2012-03-10 ENCOUNTER — Emergency Department (HOSPITAL_COMMUNITY)
Admission: EM | Admit: 2012-03-10 | Discharge: 2012-03-10 | Disposition: A | Discharge status: Home / Self Care | Payer: Self-pay | Attending: Emergency Medicine | Admitting: Emergency Medicine

## 2012-03-10 ENCOUNTER — Emergency Department (HOSPITAL_COMMUNITY): Payer: Self-pay

## 2012-03-10 DIAGNOSIS — Y929 Unspecified place or not applicable: Secondary | ICD-10-CM | POA: Insufficient documentation

## 2012-03-10 DIAGNOSIS — F17200 Nicotine dependence, unspecified, uncomplicated: Secondary | ICD-10-CM | POA: Insufficient documentation

## 2012-03-10 DIAGNOSIS — S0993XA Unspecified injury of face, initial encounter: Secondary | ICD-10-CM

## 2012-03-10 DIAGNOSIS — F319 Bipolar disorder, unspecified: Secondary | ICD-10-CM | POA: Insufficient documentation

## 2012-03-10 DIAGNOSIS — F411 Generalized anxiety disorder: Secondary | ICD-10-CM | POA: Insufficient documentation

## 2012-03-10 DIAGNOSIS — I10 Essential (primary) hypertension: Secondary | ICD-10-CM | POA: Insufficient documentation

## 2012-03-10 DIAGNOSIS — Z79899 Other long term (current) drug therapy: Secondary | ICD-10-CM | POA: Insufficient documentation

## 2012-03-10 DIAGNOSIS — S025XXA Fracture of tooth (traumatic), initial encounter for closed fracture: Secondary | ICD-10-CM | POA: Insufficient documentation

## 2012-03-10 DIAGNOSIS — G43909 Migraine, unspecified, not intractable, without status migrainosus: Secondary | ICD-10-CM | POA: Insufficient documentation

## 2012-03-10 DIAGNOSIS — R51 Headache: Secondary | ICD-10-CM | POA: Insufficient documentation

## 2012-03-10 DIAGNOSIS — W64XXXA Exposure to other animate mechanical forces, initial encounter: Secondary | ICD-10-CM | POA: Insufficient documentation

## 2012-03-10 DIAGNOSIS — Z8739 Personal history of other diseases of the musculoskeletal system and connective tissue: Secondary | ICD-10-CM | POA: Insufficient documentation

## 2012-03-10 DIAGNOSIS — Y939 Activity, unspecified: Secondary | ICD-10-CM | POA: Insufficient documentation

## 2012-03-10 MED ORDER — OXYCODONE-ACETAMINOPHEN 5-325 MG PO TABS: 1.0000 | ORAL_TABLET | ORAL | Status: DC | PRN

## 2012-03-10 MED ORDER — OXYCODONE HCL 5 MG PO TABS
5.0000 mg | ORAL_TABLET | Freq: Once | ORAL | Status: AC
  Administered 2012-03-10: 5 mg via ORAL
  Filled 2012-03-10: qty 1
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
## 2012-03-10 NOTE — ED Notes (Signed)
Hope neese, NP in room with pt

## 2012-03-10 NOTE — ED Notes (Signed)
Pt ambulated to BR

## 2012-03-10 NOTE — ED Notes (Signed)
Pt had a horse kick to his mouth on Friday, one front tooth knocked out, pt states that he did pass out for few seconds, denies blurry vision or N/V
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
## 2012-03-10 NOTE — ED Notes (Signed)
Patient transported to CT 

## 2012-03-11 NOTE — ED Provider Notes (Signed)
Medical screening examination/treatment/procedure(s) were performed by non-physician practitioner and as supervising physician I was immediately available for consultation/collaboration.   Lyanne Co, MD 03/11/12 1115

## 2012-03-13 ENCOUNTER — Encounter (HOSPITAL_COMMUNITY): Payer: Self-pay

## 2012-03-13 ENCOUNTER — Emergency Department (HOSPITAL_COMMUNITY)
Admission: EM | Admit: 2012-03-13 | Discharge: 2012-03-13 | Disposition: A | Discharge status: Home / Self Care | Payer: Self-pay | Attending: Emergency Medicine | Admitting: Emergency Medicine

## 2012-03-13 DIAGNOSIS — K0889 Other specified disorders of teeth and supporting structures: Secondary | ICD-10-CM

## 2012-03-13 DIAGNOSIS — Z79899 Other long term (current) drug therapy: Secondary | ICD-10-CM | POA: Insufficient documentation

## 2012-03-13 DIAGNOSIS — Z792 Long term (current) use of antibiotics: Secondary | ICD-10-CM | POA: Insufficient documentation

## 2012-03-13 DIAGNOSIS — G8929 Other chronic pain: Secondary | ICD-10-CM | POA: Insufficient documentation

## 2012-03-13 DIAGNOSIS — K089 Disorder of teeth and supporting structures, unspecified: Secondary | ICD-10-CM | POA: Insufficient documentation

## 2012-03-13 DIAGNOSIS — G40909 Epilepsy, unspecified, not intractable, without status epilepticus: Secondary | ICD-10-CM | POA: Insufficient documentation

## 2012-03-13 DIAGNOSIS — Z791 Long term (current) use of non-steroidal anti-inflammatories (NSAID): Secondary | ICD-10-CM | POA: Insufficient documentation

## 2012-03-13 DIAGNOSIS — Z760 Encounter for issue of repeat prescription: Secondary | ICD-10-CM | POA: Insufficient documentation

## 2012-03-13 DIAGNOSIS — F17200 Nicotine dependence, unspecified, uncomplicated: Secondary | ICD-10-CM | POA: Insufficient documentation

## 2012-03-13 DIAGNOSIS — Z8781 Personal history of (healed) traumatic fracture: Secondary | ICD-10-CM | POA: Insufficient documentation

## 2012-03-13 DIAGNOSIS — M549 Dorsalgia, unspecified: Secondary | ICD-10-CM | POA: Insufficient documentation

## 2012-03-13 DIAGNOSIS — F411 Generalized anxiety disorder: Secondary | ICD-10-CM | POA: Insufficient documentation

## 2012-03-13 DIAGNOSIS — I10 Essential (primary) hypertension: Secondary | ICD-10-CM | POA: Insufficient documentation

## 2012-03-13 DIAGNOSIS — Z8659 Personal history of other mental and behavioral disorders: Secondary | ICD-10-CM | POA: Insufficient documentation

## 2012-03-13 MED ORDER — TRAMADOL HCL 50 MG PO TABS: 50.0000 mg | ORAL_TABLET | Freq: Four times a day (QID) | ORAL | Status: DC | PRN

## 2012-03-13 NOTE — ED Notes (Signed)
Pt here today for medication refill, ran out of oxycodone.  Unable to follow up w/ dentist for 2 weeks.

## 2012-03-13 NOTE — ED Notes (Signed)
Gauze given to pt to apply to gum area.
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
## 2012-03-14 NOTE — ED Provider Notes (Signed)
Medical screening examination/treatment/procedure(s) were performed by non-physician practitioner and as supervising physician I was immediately available for consultation/collaboration. Andreika Vandagriff, MD, FACEP   Mizuki Hoel L Lekendrick Alpern, MD 03/14/12 1455 

## 2012-03-20 ENCOUNTER — Emergency Department (HOSPITAL_COMMUNITY)
Admission: EM | Admit: 2012-03-20 | Discharge: 2012-03-20 | Disposition: A | Discharge status: Home / Self Care | Payer: Self-pay | Attending: Emergency Medicine | Admitting: Emergency Medicine

## 2012-03-20 ENCOUNTER — Encounter (HOSPITAL_COMMUNITY): Payer: Self-pay | Admitting: Emergency Medicine

## 2012-03-20 DIAGNOSIS — F17200 Nicotine dependence, unspecified, uncomplicated: Secondary | ICD-10-CM | POA: Insufficient documentation

## 2012-03-20 DIAGNOSIS — G40909 Epilepsy, unspecified, not intractable, without status epilepticus: Secondary | ICD-10-CM | POA: Insufficient documentation

## 2012-03-20 DIAGNOSIS — Y939 Activity, unspecified: Secondary | ICD-10-CM | POA: Insufficient documentation

## 2012-03-20 DIAGNOSIS — S199XXA Unspecified injury of neck, initial encounter: Secondary | ICD-10-CM | POA: Insufficient documentation

## 2012-03-20 DIAGNOSIS — M549 Dorsalgia, unspecified: Secondary | ICD-10-CM | POA: Insufficient documentation

## 2012-03-20 DIAGNOSIS — Z8659 Personal history of other mental and behavioral disorders: Secondary | ICD-10-CM | POA: Insufficient documentation

## 2012-03-20 DIAGNOSIS — K0889 Other specified disorders of teeth and supporting structures: Secondary | ICD-10-CM

## 2012-03-20 DIAGNOSIS — F411 Generalized anxiety disorder: Secondary | ICD-10-CM | POA: Insufficient documentation

## 2012-03-20 DIAGNOSIS — G8929 Other chronic pain: Secondary | ICD-10-CM | POA: Insufficient documentation

## 2012-03-20 DIAGNOSIS — S0993XA Unspecified injury of face, initial encounter: Secondary | ICD-10-CM | POA: Insufficient documentation

## 2012-03-20 DIAGNOSIS — Z79899 Other long term (current) drug therapy: Secondary | ICD-10-CM | POA: Insufficient documentation

## 2012-03-20 DIAGNOSIS — S025XXA Fracture of tooth (traumatic), initial encounter for closed fracture: Secondary | ICD-10-CM | POA: Insufficient documentation

## 2012-03-20 DIAGNOSIS — W64XXXA Exposure to other animate mechanical forces, initial encounter: Secondary | ICD-10-CM | POA: Insufficient documentation

## 2012-03-20 DIAGNOSIS — I10 Essential (primary) hypertension: Secondary | ICD-10-CM | POA: Insufficient documentation

## 2012-03-20 DIAGNOSIS — R110 Nausea: Secondary | ICD-10-CM | POA: Insufficient documentation

## 2012-03-20 DIAGNOSIS — Y929 Unspecified place or not applicable: Secondary | ICD-10-CM | POA: Insufficient documentation

## 2012-03-20 DIAGNOSIS — F319 Bipolar disorder, unspecified: Secondary | ICD-10-CM | POA: Insufficient documentation

## 2012-03-20 HISTORY — DX: Post-traumatic stress disorder, unspecified: F43.10

## 2012-03-20 MED ORDER — TRAMADOL HCL 50 MG PO TABS
100.0000 mg | ORAL_TABLET | Freq: Once | ORAL | Status: AC
  Administered 2012-03-20: 100 mg via ORAL
  Filled 2012-03-20 (×2): qty 1

## 2012-03-20 MED ORDER — ONDANSETRON 4 MG PO TBDP: 4.0000 mg | ORAL_TABLET | Freq: Three times a day (TID) | ORAL | Status: DC | PRN

## 2012-03-20 MED ORDER — TRAMADOL HCL 50 MG PO TABS: 50.0000 mg | ORAL_TABLET | Freq: Four times a day (QID) | ORAL | Status: DC | PRN

## 2012-03-20 MED ORDER — ONDANSETRON 4 MG PO TBDP
4.0000 mg | ORAL_TABLET | Freq: Once | ORAL | Status: AC
  Administered 2012-03-20: 4 mg via ORAL
  Filled 2012-03-20: qty 1

## 2012-03-20 NOTE — ED Notes (Signed)
States dental pain has now moved up into his nose and eyes - referred to oral surgeon on last visit - has not seen yet
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
## 2012-04-08 ENCOUNTER — Encounter (HOSPITAL_COMMUNITY): Payer: Self-pay | Admitting: *Deleted

## 2012-04-08 ENCOUNTER — Emergency Department (HOSPITAL_COMMUNITY)
Admission: EM | Admit: 2012-04-08 | Discharge: 2012-04-08 | Disposition: A | Discharge status: Home / Self Care | Payer: Self-pay | Attending: Emergency Medicine | Admitting: Emergency Medicine

## 2012-04-08 DIAGNOSIS — Z79899 Other long term (current) drug therapy: Secondary | ICD-10-CM | POA: Insufficient documentation

## 2012-04-08 DIAGNOSIS — G8929 Other chronic pain: Secondary | ICD-10-CM | POA: Insufficient documentation

## 2012-04-08 DIAGNOSIS — Z8669 Personal history of other diseases of the nervous system and sense organs: Secondary | ICD-10-CM | POA: Insufficient documentation

## 2012-04-08 DIAGNOSIS — M549 Dorsalgia, unspecified: Secondary | ICD-10-CM | POA: Insufficient documentation

## 2012-04-08 DIAGNOSIS — F319 Bipolar disorder, unspecified: Secondary | ICD-10-CM | POA: Insufficient documentation

## 2012-04-08 DIAGNOSIS — F411 Generalized anxiety disorder: Secondary | ICD-10-CM | POA: Insufficient documentation

## 2012-04-08 DIAGNOSIS — I10 Essential (primary) hypertension: Secondary | ICD-10-CM | POA: Insufficient documentation

## 2012-04-08 DIAGNOSIS — F17200 Nicotine dependence, unspecified, uncomplicated: Secondary | ICD-10-CM | POA: Insufficient documentation

## 2012-04-08 DIAGNOSIS — Z8659 Personal history of other mental and behavioral disorders: Secondary | ICD-10-CM | POA: Insufficient documentation

## 2012-04-08 DIAGNOSIS — K089 Disorder of teeth and supporting structures, unspecified: Secondary | ICD-10-CM | POA: Insufficient documentation

## 2012-04-08 NOTE — ED Notes (Signed)
Pt and family member very upset at time of d/c about not receiving RX.  PA spoke with Dr Clarene Duke  And no rx written.
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
## 2012-04-08 NOTE — ED Notes (Signed)
Pt presents to er for prescriptions refilled, pt states that he is a patient of Dr. Bascom Levels and Dr. Bascom Levels is being invistigated by [employer]. Pt states that he is out of the following prescriptions 1. Adderall 20 mg TID 2. Oxycodone 15 mg TID 3. Percocet 5-325 mg prn every 4-6 hours

## 2012-04-08 NOTE — ED Notes (Signed)
Alert, NAD,sitting up in chair.

## 2012-04-15 NOTE — ED Provider Notes (Signed)
Medical screening examination/treatment/procedure(s) were performed by non-physician practitioner and as supervising physician I was immediately available for consultation/collaboration.   Laray Anger, DO 04/15/12 2215

## 2012-12-08 ENCOUNTER — Emergency Department (HOSPITAL_COMMUNITY)
Admission: EM | Admit: 2012-12-08 | Discharge: 2012-12-08 | Disposition: A | Discharge status: Home / Self Care | Payer: Medicaid Other | Attending: Emergency Medicine | Admitting: Emergency Medicine

## 2012-12-08 ENCOUNTER — Encounter (HOSPITAL_COMMUNITY): Payer: Self-pay | Admitting: Emergency Medicine

## 2012-12-08 DIAGNOSIS — G40909 Epilepsy, unspecified, not intractable, without status epilepticus: Secondary | ICD-10-CM | POA: Insufficient documentation

## 2012-12-08 DIAGNOSIS — I10 Essential (primary) hypertension: Secondary | ICD-10-CM | POA: Insufficient documentation

## 2012-12-08 DIAGNOSIS — Z79899 Other long term (current) drug therapy: Secondary | ICD-10-CM | POA: Insufficient documentation

## 2012-12-08 DIAGNOSIS — K089 Disorder of teeth and supporting structures, unspecified: Secondary | ICD-10-CM | POA: Insufficient documentation

## 2012-12-08 DIAGNOSIS — G8929 Other chronic pain: Secondary | ICD-10-CM | POA: Insufficient documentation

## 2012-12-08 DIAGNOSIS — K029 Dental caries, unspecified: Secondary | ICD-10-CM | POA: Insufficient documentation

## 2012-12-08 DIAGNOSIS — Z792 Long term (current) use of antibiotics: Secondary | ICD-10-CM | POA: Insufficient documentation

## 2012-12-08 DIAGNOSIS — Z87828 Personal history of other (healed) physical injury and trauma: Secondary | ICD-10-CM | POA: Insufficient documentation

## 2012-12-08 DIAGNOSIS — K0889 Other specified disorders of teeth and supporting structures: Secondary | ICD-10-CM

## 2012-12-08 DIAGNOSIS — F411 Generalized anxiety disorder: Secondary | ICD-10-CM | POA: Insufficient documentation

## 2012-12-08 DIAGNOSIS — F17200 Nicotine dependence, unspecified, uncomplicated: Secondary | ICD-10-CM | POA: Insufficient documentation

## 2012-12-08 DIAGNOSIS — R51 Headache: Secondary | ICD-10-CM | POA: Insufficient documentation

## 2012-12-08 HISTORY — DX: Unspecified injury of head, initial encounter: S09.90XA

## 2012-12-08 MED ORDER — TRAMADOL HCL 50 MG PO TABS: 50.0000 mg | ORAL_TABLET | Freq: Four times a day (QID) | ORAL | Status: DC | PRN

## 2012-12-08 NOTE — ED Notes (Signed)
Patient reports taking BC powders, ibuprofen, and tylenol with no relief. Patient has ice pack to left side of jaw.

## 2012-12-08 NOTE — ED Provider Notes (Signed)
History/physical exam/procedure(s) were performed by non-physician practitioner and as supervising physician I was immediately available for consultation/collaboration. I have reviewed all notes and am in agreement with care and plan.   Trevaun Rendleman S Efrem Pitstick, MD 12/08/12 1559 

## 2012-12-08 NOTE — ED Notes (Signed)
Patient c/o dental pain. Per patient hx of seizure disorder. Patient states that he has chipped and broken teeth from having seizures and the pain from the teeth has progressively gotten worse. Patient states that he called dentist 5 days ago and got prescription for amoxicillin. Patient told to come here if he need something for the pain. Per patient pain worse and left side of face swollen. Patient has appointment to have teeth pulled in three weeks.
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
## 2013-01-16 ENCOUNTER — Encounter (HOSPITAL_COMMUNITY): Payer: Self-pay | Admitting: Emergency Medicine

## 2013-01-16 ENCOUNTER — Emergency Department (HOSPITAL_COMMUNITY): Payer: Self-pay

## 2013-01-16 ENCOUNTER — Emergency Department (HOSPITAL_COMMUNITY)
Admission: EM | Admit: 2013-01-16 | Discharge: 2013-01-16 | Disposition: A | Discharge status: Home / Self Care | Payer: Self-pay | Attending: Emergency Medicine | Admitting: Emergency Medicine

## 2013-01-16 DIAGNOSIS — R110 Nausea: Secondary | ICD-10-CM | POA: Insufficient documentation

## 2013-01-16 DIAGNOSIS — G40909 Epilepsy, unspecified, not intractable, without status epilepticus: Secondary | ICD-10-CM | POA: Insufficient documentation

## 2013-01-16 DIAGNOSIS — G8929 Other chronic pain: Secondary | ICD-10-CM | POA: Insufficient documentation

## 2013-01-16 DIAGNOSIS — Z87891 Personal history of nicotine dependence: Secondary | ICD-10-CM | POA: Insufficient documentation

## 2013-01-16 DIAGNOSIS — Z792 Long term (current) use of antibiotics: Secondary | ICD-10-CM | POA: Insufficient documentation

## 2013-01-16 DIAGNOSIS — F411 Generalized anxiety disorder: Secondary | ICD-10-CM | POA: Insufficient documentation

## 2013-01-16 DIAGNOSIS — Z87828 Personal history of other (healed) physical injury and trauma: Secondary | ICD-10-CM | POA: Insufficient documentation

## 2013-01-16 DIAGNOSIS — J40 Bronchitis, not specified as acute or chronic: Secondary | ICD-10-CM

## 2013-01-16 DIAGNOSIS — IMO0002 Reserved for concepts with insufficient information to code with codable children: Secondary | ICD-10-CM | POA: Insufficient documentation

## 2013-01-16 DIAGNOSIS — I10 Essential (primary) hypertension: Secondary | ICD-10-CM | POA: Insufficient documentation

## 2013-01-16 DIAGNOSIS — Z79899 Other long term (current) drug therapy: Secondary | ICD-10-CM | POA: Insufficient documentation

## 2013-01-16 DIAGNOSIS — J209 Acute bronchitis, unspecified: Secondary | ICD-10-CM | POA: Insufficient documentation

## 2013-01-16 DIAGNOSIS — R079 Chest pain, unspecified: Secondary | ICD-10-CM | POA: Insufficient documentation

## 2013-01-16 MED ORDER — HYDROCODONE-ACETAMINOPHEN 5-325 MG PO TABS: 1.0000 | ORAL_TABLET | Freq: Four times a day (QID) | ORAL | Status: DC | PRN

## 2013-01-16 MED ORDER — DOXYCYCLINE HYCLATE 100 MG PO TABS
100.0000 mg | ORAL_TABLET | Freq: Once | ORAL | Status: AC
  Administered 2013-01-16: 100 mg via ORAL
  Filled 2013-01-16: qty 1

## 2013-01-16 MED ORDER — BENZONATATE 100 MG PO CAPS: 100.0000 mg | ORAL_CAPSULE | Freq: Two times a day (BID) | ORAL | Status: DC | PRN

## 2013-01-16 MED ORDER — BENZONATATE 100 MG PO CAPS
100.0000 mg | ORAL_CAPSULE | Freq: Once | ORAL | Status: AC
  Administered 2013-01-16: 100 mg via ORAL
  Filled 2013-01-16: qty 1

## 2013-01-16 MED ORDER — GUAIFENESIN-CODEINE 100-10 MG/5ML PO SOLN: 5.0000 mL | Freq: Four times a day (QID) | ORAL | Status: DC | PRN

## 2013-01-16 MED ORDER — DOXYCYCLINE HYCLATE 100 MG PO CAPS: 100.0000 mg | ORAL_CAPSULE | Freq: Two times a day (BID) | ORAL | Status: DC

## 2013-01-16 MED ORDER — HYDROCODONE-ACETAMINOPHEN 5-325 MG PO TABS
2.0000 | ORAL_TABLET | Freq: Once | ORAL | Status: AC
  Administered 2013-01-16: 2 via ORAL
  Filled 2013-01-16: qty 2
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
## 2013-01-16 NOTE — ED Notes (Signed)
Pt states cough, seen by PMD last week. Being treated for bronchitis. Pt states throat is very sore from coughing. States hx of spontaneous pneumo x 2. States cough is getting worse. Productive cough was green in color, now clear since starting antibiotics.

## 2013-03-14 ENCOUNTER — Encounter (HOSPITAL_COMMUNITY): Payer: Self-pay | Admitting: Emergency Medicine

## 2013-03-14 ENCOUNTER — Emergency Department (HOSPITAL_COMMUNITY)
Admission: EM | Admit: 2013-03-14 | Discharge: 2013-03-14 | Disposition: A | Discharge status: Home / Self Care | Payer: Medicaid Other | Attending: Emergency Medicine | Admitting: Emergency Medicine

## 2013-03-14 ENCOUNTER — Emergency Department (HOSPITAL_COMMUNITY): Payer: Medicaid Other

## 2013-03-14 DIAGNOSIS — Z87891 Personal history of nicotine dependence: Secondary | ICD-10-CM | POA: Insufficient documentation

## 2013-03-14 DIAGNOSIS — Z79899 Other long term (current) drug therapy: Secondary | ICD-10-CM | POA: Insufficient documentation

## 2013-03-14 DIAGNOSIS — R0602 Shortness of breath: Secondary | ICD-10-CM | POA: Insufficient documentation

## 2013-03-14 DIAGNOSIS — I10 Essential (primary) hypertension: Secondary | ICD-10-CM | POA: Insufficient documentation

## 2013-03-14 DIAGNOSIS — W64XXXA Exposure to other animate mechanical forces, initial encounter: Secondary | ICD-10-CM | POA: Insufficient documentation

## 2013-03-14 DIAGNOSIS — S298XXA Other specified injuries of thorax, initial encounter: Secondary | ICD-10-CM | POA: Insufficient documentation

## 2013-03-14 DIAGNOSIS — Y929 Unspecified place or not applicable: Secondary | ICD-10-CM | POA: Insufficient documentation

## 2013-03-14 DIAGNOSIS — R1110 Vomiting, unspecified: Secondary | ICD-10-CM | POA: Insufficient documentation

## 2013-03-14 DIAGNOSIS — Z87828 Personal history of other (healed) physical injury and trauma: Secondary | ICD-10-CM | POA: Insufficient documentation

## 2013-03-14 DIAGNOSIS — Y939 Activity, unspecified: Secondary | ICD-10-CM | POA: Insufficient documentation

## 2013-03-14 DIAGNOSIS — S3991XA Unspecified injury of abdomen, initial encounter: Secondary | ICD-10-CM

## 2013-03-14 DIAGNOSIS — G40909 Epilepsy, unspecified, not intractable, without status epilepticus: Secondary | ICD-10-CM | POA: Insufficient documentation

## 2013-03-14 DIAGNOSIS — F411 Generalized anxiety disorder: Secondary | ICD-10-CM | POA: Insufficient documentation

## 2013-03-14 DIAGNOSIS — S3981XA Other specified injuries of abdomen, initial encounter: Secondary | ICD-10-CM | POA: Insufficient documentation

## 2013-03-14 DIAGNOSIS — G8929 Other chronic pain: Secondary | ICD-10-CM | POA: Insufficient documentation

## 2013-03-14 DIAGNOSIS — Z8709 Personal history of other diseases of the respiratory system: Secondary | ICD-10-CM | POA: Insufficient documentation

## 2013-03-14 HISTORY — DX: Atelectasis: J98.11

## 2013-03-14 LAB — POCT I-STAT, CHEM 8
BUN: 13 mg/dL (ref 6–23)
CALCIUM ION: 1.13 mmol/L (ref 1.12–1.23)
Chloride: 105 mEq/L (ref 96–112)
Creatinine, Ser: 0.8 mg/dL (ref 0.50–1.35)
GLUCOSE: 90 mg/dL (ref 70–99)
HCT: 44 % (ref 39.0–52.0)
Hemoglobin: 15 g/dL (ref 13.0–17.0)
Potassium: 3.9 mEq/L (ref 3.7–5.3)
Sodium: 142 mEq/L (ref 137–147)
TCO2: 22 mmol/L (ref 0–100)

## 2013-03-14 MED ORDER — OXYCODONE-ACETAMINOPHEN 5-325 MG PO TABS
ORAL_TABLET | ORAL | Status: AC
  Filled 2013-03-14: qty 2

## 2013-03-14 MED ORDER — OXYCODONE HCL 5 MG PO TABS: 5.0000 mg | ORAL_TABLET | Freq: Four times a day (QID) | ORAL | Status: DC | PRN

## 2013-03-14 MED ORDER — HYDROMORPHONE HCL PF 1 MG/ML IJ SOLN
1.0000 mg | Freq: Once | INTRAMUSCULAR | Status: AC
  Administered 2013-03-14: 1 mg via INTRAVENOUS
  Filled 2013-03-14: qty 1

## 2013-03-14 MED ORDER — IOHEXOL 300 MG/ML  SOLN
100.0000 mL | Freq: Once | INTRAMUSCULAR | Status: AC | PRN
  Administered 2013-03-14: 100 mL via INTRAVENOUS

## 2013-03-14 MED ORDER — ONDANSETRON HCL 4 MG/2ML IJ SOLN
4.0000 mg | Freq: Once | INTRAMUSCULAR | Status: AC
  Administered 2013-03-14: 4 mg via INTRAVENOUS
  Filled 2013-03-14: qty 2

## 2013-03-14 MED ORDER — OXYCODONE-ACETAMINOPHEN 5-325 MG PO TABS
2.0000 | ORAL_TABLET | Freq: Once | ORAL | Status: AC
  Administered 2013-03-14: 2 via ORAL
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
## 2013-03-14 NOTE — ED Notes (Signed)
Patient given discharge instruction, verbalized understand. Patient ambulatory out of the department.  
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
## 2013-03-17 ENCOUNTER — Encounter (HOSPITAL_COMMUNITY): Payer: Self-pay | Admitting: Emergency Medicine

## 2013-03-17 ENCOUNTER — Emergency Department (HOSPITAL_COMMUNITY)
Admission: EM | Admit: 2013-03-17 | Discharge: 2013-03-17 | Disposition: A | Discharge status: Home / Self Care | Payer: Medicaid Other | Attending: Emergency Medicine | Admitting: Emergency Medicine

## 2013-03-17 DIAGNOSIS — R109 Unspecified abdominal pain: Secondary | ICD-10-CM

## 2013-03-17 DIAGNOSIS — F411 Generalized anxiety disorder: Secondary | ICD-10-CM | POA: Insufficient documentation

## 2013-03-17 DIAGNOSIS — Z8709 Personal history of other diseases of the respiratory system: Secondary | ICD-10-CM | POA: Insufficient documentation

## 2013-03-17 DIAGNOSIS — F319 Bipolar disorder, unspecified: Secondary | ICD-10-CM | POA: Insufficient documentation

## 2013-03-17 DIAGNOSIS — R1110 Vomiting, unspecified: Secondary | ICD-10-CM | POA: Insufficient documentation

## 2013-03-17 DIAGNOSIS — G40909 Epilepsy, unspecified, not intractable, without status epilepticus: Secondary | ICD-10-CM | POA: Insufficient documentation

## 2013-03-17 DIAGNOSIS — G8929 Other chronic pain: Secondary | ICD-10-CM | POA: Insufficient documentation

## 2013-03-17 DIAGNOSIS — Z87891 Personal history of nicotine dependence: Secondary | ICD-10-CM | POA: Insufficient documentation

## 2013-03-17 DIAGNOSIS — R1012 Left upper quadrant pain: Secondary | ICD-10-CM | POA: Insufficient documentation

## 2013-03-17 DIAGNOSIS — R569 Unspecified convulsions: Secondary | ICD-10-CM

## 2013-03-17 DIAGNOSIS — Z79899 Other long term (current) drug therapy: Secondary | ICD-10-CM | POA: Insufficient documentation

## 2013-03-17 DIAGNOSIS — R071 Chest pain on breathing: Secondary | ICD-10-CM | POA: Insufficient documentation

## 2013-03-17 DIAGNOSIS — Z87828 Personal history of other (healed) physical injury and trauma: Secondary | ICD-10-CM | POA: Insufficient documentation

## 2013-03-17 DIAGNOSIS — R0789 Other chest pain: Secondary | ICD-10-CM

## 2013-03-17 DIAGNOSIS — I10 Essential (primary) hypertension: Secondary | ICD-10-CM | POA: Insufficient documentation

## 2013-03-17 MED ORDER — ONDANSETRON 8 MG PO TBDP
8.0000 mg | ORAL_TABLET | Freq: Once | ORAL | Status: AC
  Administered 2013-03-17: 8 mg via ORAL
  Filled 2013-03-17: qty 1

## 2013-03-17 MED ORDER — ONDANSETRON HCL 8 MG PO TABS
8.0000 mg | ORAL_TABLET | Freq: Three times a day (TID) | ORAL | Status: DC | PRN
Start: 2013-03-17 — End: 2013-04-15

## 2013-03-17 MED ORDER — LORAZEPAM 1 MG PO TABS
2.0000 mg | ORAL_TABLET | Freq: Once | ORAL | Status: AC
  Administered 2013-03-17: 2 mg via ORAL
  Filled 2013-03-17: qty 2
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
## 2013-03-17 NOTE — ED Notes (Signed)
Pt reportedly had a seizure while in the waiting area and was brought back to treatment area.
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
## 2013-03-17 NOTE — ED Notes (Addendum)
Called to lobby by registration staff. They say pt fell out of chair and was having  A seizure.  Pt is actively seizing with movement of arms and legs.eyes closed   I saw sz for app 30 sec. Then opened his eyes and began talking.  Assisted to stretcher and taken to tx room 7 , Report given to  CN .  Pt alert, and talking at present.  Headache

## 2013-03-17 NOTE — Discharge Instructions (Signed)
Please be aware you may have another seizure ° °Do not drive until seen by your physician for your condition ° °Do not climb ladders/roofs/trees as a seizure can occur at that height and cause serious harm ° °Do not bathe/swim alone as a seizure can occur and cause serious harm ° °Please followup with your physician or neurologist for further testing and possible treatment ° ° °
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
## 2013-04-15 ENCOUNTER — Emergency Department (HOSPITAL_COMMUNITY)
Admission: EM | Admit: 2013-04-15 | Discharge: 2013-04-15 | Disposition: A | Discharge status: Home / Self Care | Payer: Self-pay | Attending: Emergency Medicine | Admitting: Emergency Medicine

## 2013-04-15 ENCOUNTER — Encounter (HOSPITAL_COMMUNITY): Payer: Self-pay | Admitting: Emergency Medicine

## 2013-04-15 ENCOUNTER — Emergency Department (HOSPITAL_COMMUNITY): Payer: Self-pay

## 2013-04-15 DIAGNOSIS — Z87891 Personal history of nicotine dependence: Secondary | ICD-10-CM | POA: Insufficient documentation

## 2013-04-15 DIAGNOSIS — R319 Hematuria, unspecified: Secondary | ICD-10-CM | POA: Insufficient documentation

## 2013-04-15 DIAGNOSIS — Z79899 Other long term (current) drug therapy: Secondary | ICD-10-CM | POA: Insufficient documentation

## 2013-04-15 DIAGNOSIS — H539 Unspecified visual disturbance: Secondary | ICD-10-CM | POA: Insufficient documentation

## 2013-04-15 DIAGNOSIS — S139XXA Sprain of joints and ligaments of unspecified parts of neck, initial encounter: Secondary | ICD-10-CM | POA: Insufficient documentation

## 2013-04-15 DIAGNOSIS — S3981XA Other specified injuries of abdomen, initial encounter: Secondary | ICD-10-CM | POA: Insufficient documentation

## 2013-04-15 DIAGNOSIS — Y9389 Activity, other specified: Secondary | ICD-10-CM | POA: Insufficient documentation

## 2013-04-15 DIAGNOSIS — F319 Bipolar disorder, unspecified: Secondary | ICD-10-CM | POA: Insufficient documentation

## 2013-04-15 DIAGNOSIS — R112 Nausea with vomiting, unspecified: Secondary | ICD-10-CM | POA: Insufficient documentation

## 2013-04-15 DIAGNOSIS — Z8709 Personal history of other diseases of the respiratory system: Secondary | ICD-10-CM | POA: Insufficient documentation

## 2013-04-15 DIAGNOSIS — G40909 Epilepsy, unspecified, not intractable, without status epilepticus: Secondary | ICD-10-CM | POA: Insufficient documentation

## 2013-04-15 DIAGNOSIS — Y92410 Unspecified street and highway as the place of occurrence of the external cause: Secondary | ICD-10-CM | POA: Insufficient documentation

## 2013-04-15 DIAGNOSIS — S161XXA Strain of muscle, fascia and tendon at neck level, initial encounter: Secondary | ICD-10-CM

## 2013-04-15 DIAGNOSIS — I10 Essential (primary) hypertension: Secondary | ICD-10-CM | POA: Insufficient documentation

## 2013-04-15 DIAGNOSIS — G8929 Other chronic pain: Secondary | ICD-10-CM | POA: Insufficient documentation

## 2013-04-15 DIAGNOSIS — R209 Unspecified disturbances of skin sensation: Secondary | ICD-10-CM | POA: Insufficient documentation

## 2013-04-15 DIAGNOSIS — Z87828 Personal history of other (healed) physical injury and trauma: Secondary | ICD-10-CM | POA: Insufficient documentation

## 2013-04-15 HISTORY — DX: Abnormal levels of other serum enzymes: R74.8

## 2013-04-15 LAB — URINALYSIS, ROUTINE W REFLEX MICROSCOPIC
Bilirubin Urine: NEGATIVE
Glucose, UA: NEGATIVE mg/dL
Ketones, ur: NEGATIVE mg/dL
Nitrite: NEGATIVE
Protein, ur: 30 mg/dL — AB
Specific Gravity, Urine: <1.005 — ABNORMAL LOW (ref 1.005–1.030)
Urobilinogen, UA: 0.2 mg/dL (ref 0.0–1.0)
pH: 5 (ref 5.0–8.0)

## 2013-04-15 LAB — URINE MICROSCOPIC-ADD ON

## 2013-04-15 MED ORDER — IBUPROFEN 400 MG PO TABS
600.0000 mg | ORAL_TABLET | Freq: Once | ORAL | Status: AC
  Administered 2013-04-15: 600 mg via ORAL
  Filled 2013-04-15: qty 2

## 2013-04-15 MED ORDER — MORPHINE SULFATE 4 MG/ML IJ SOLN
6.0000 mg | Freq: Once | INTRAMUSCULAR | Status: AC
  Administered 2013-04-15: 6 mg via INTRAMUSCULAR
  Filled 2013-04-15: qty 2
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
## 2013-04-15 NOTE — ED Notes (Signed)
Pt seen out in lobby rubbing his neck with his left arm and playing with his cellphone with his right arm,
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
## 2013-04-17 LAB — URINE CULTURE: Colony Count: >=100000

## 2013-08-21 ENCOUNTER — Emergency Department (HOSPITAL_COMMUNITY)
Admission: EM | Admit: 2013-08-21 | Discharge: 2013-08-21 | Disposition: A | Discharge status: Home / Self Care | Payer: No Typology Code available for payment source | Attending: Emergency Medicine | Admitting: Emergency Medicine

## 2013-08-21 ENCOUNTER — Encounter (HOSPITAL_COMMUNITY): Payer: Self-pay | Admitting: Emergency Medicine

## 2013-08-21 DIAGNOSIS — Z87891 Personal history of nicotine dependence: Secondary | ICD-10-CM | POA: Insufficient documentation

## 2013-08-21 DIAGNOSIS — I10 Essential (primary) hypertension: Secondary | ICD-10-CM | POA: Insufficient documentation

## 2013-08-21 DIAGNOSIS — Y9389 Activity, other specified: Secondary | ICD-10-CM | POA: Insufficient documentation

## 2013-08-21 DIAGNOSIS — Z8659 Personal history of other mental and behavioral disorders: Secondary | ICD-10-CM | POA: Insufficient documentation

## 2013-08-21 DIAGNOSIS — IMO0002 Reserved for concepts with insufficient information to code with codable children: Secondary | ICD-10-CM | POA: Insufficient documentation

## 2013-08-21 DIAGNOSIS — X500XXA Overexertion from strenuous movement or load, initial encounter: Secondary | ICD-10-CM | POA: Insufficient documentation

## 2013-08-21 DIAGNOSIS — Z79899 Other long term (current) drug therapy: Secondary | ICD-10-CM | POA: Insufficient documentation

## 2013-08-21 DIAGNOSIS — F411 Generalized anxiety disorder: Secondary | ICD-10-CM | POA: Insufficient documentation

## 2013-08-21 DIAGNOSIS — M5416 Radiculopathy, lumbar region: Secondary | ICD-10-CM

## 2013-08-21 DIAGNOSIS — Z8709 Personal history of other diseases of the respiratory system: Secondary | ICD-10-CM | POA: Insufficient documentation

## 2013-08-21 DIAGNOSIS — Z87828 Personal history of other (healed) physical injury and trauma: Secondary | ICD-10-CM | POA: Insufficient documentation

## 2013-08-21 DIAGNOSIS — G8929 Other chronic pain: Secondary | ICD-10-CM | POA: Insufficient documentation

## 2013-08-21 DIAGNOSIS — X503XXA Overexertion from repetitive movements, initial encounter: Secondary | ICD-10-CM | POA: Insufficient documentation

## 2013-08-21 DIAGNOSIS — Y92009 Unspecified place in unspecified non-institutional (private) residence as the place of occurrence of the external cause: Secondary | ICD-10-CM | POA: Insufficient documentation

## 2013-08-21 MED ORDER — CYCLOBENZAPRINE HCL 10 MG PO TABS: 10.0000 mg | ORAL_TABLET | Freq: Three times a day (TID) | ORAL | Status: DC | PRN

## 2013-08-21 MED ORDER — PREDNISONE 10 MG PO TABS: ORAL_TABLET | ORAL | Status: DC

## 2013-08-21 MED ORDER — OXYCODONE HCL 5 MG PO TABS: 5.0000 mg | ORAL_TABLET | ORAL | Status: DC | PRN
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
## 2013-08-21 NOTE — ED Notes (Signed)
Pt c/o pain in lower back after lifting heavy boxes a few days ago.

## 2013-08-21 NOTE — Discharge Instructions (Signed)

## 2013-08-22 NOTE — ED Provider Notes (Signed)
Medical screening examination/treatment/procedure(s) were performed by non-physician practitioner and as supervising physician I was immediately available for consultation/collaboration.   EKG Interpretation None       Justin HornJohn M Ali Mohl, MD 08/22/13 1724

## 2013-09-06 ENCOUNTER — Encounter (HOSPITAL_COMMUNITY): Payer: Self-pay | Admitting: Emergency Medicine

## 2013-09-06 ENCOUNTER — Emergency Department (HOSPITAL_COMMUNITY)
Admission: EM | Admit: 2013-09-06 | Discharge: 2013-09-06 | Disposition: A | Discharge status: Home / Self Care | Payer: No Typology Code available for payment source | Attending: Emergency Medicine | Admitting: Emergency Medicine

## 2013-09-06 DIAGNOSIS — F411 Generalized anxiety disorder: Secondary | ICD-10-CM | POA: Insufficient documentation

## 2013-09-06 DIAGNOSIS — M549 Dorsalgia, unspecified: Secondary | ICD-10-CM | POA: Insufficient documentation

## 2013-09-06 DIAGNOSIS — Z87828 Personal history of other (healed) physical injury and trauma: Secondary | ICD-10-CM | POA: Insufficient documentation

## 2013-09-06 DIAGNOSIS — Z79899 Other long term (current) drug therapy: Secondary | ICD-10-CM | POA: Insufficient documentation

## 2013-09-06 DIAGNOSIS — IMO0002 Reserved for concepts with insufficient information to code with codable children: Secondary | ICD-10-CM | POA: Insufficient documentation

## 2013-09-06 DIAGNOSIS — I10 Essential (primary) hypertension: Secondary | ICD-10-CM | POA: Insufficient documentation

## 2013-09-06 DIAGNOSIS — G40909 Epilepsy, unspecified, not intractable, without status epilepticus: Secondary | ICD-10-CM | POA: Insufficient documentation

## 2013-09-06 DIAGNOSIS — Z87891 Personal history of nicotine dependence: Secondary | ICD-10-CM | POA: Insufficient documentation

## 2013-09-06 DIAGNOSIS — G8929 Other chronic pain: Secondary | ICD-10-CM | POA: Insufficient documentation

## 2013-09-06 HISTORY — DX: Sciatica, unspecified side: M54.30

## 2013-09-06 MED ORDER — FENTANYL CITRATE 0.05 MG/ML IJ SOLN
100.0000 ug | Freq: Once | INTRAMUSCULAR | Status: AC
  Administered 2013-09-06: 100 ug via INTRAMUSCULAR
  Filled 2013-09-06: qty 2

## 2013-09-06 MED ORDER — OXYCODONE HCL 5 MG PO TABS: 5.0000 mg | ORAL_TABLET | ORAL | Status: DC | PRN

## 2013-09-06 MED ORDER — METHOCARBAMOL 500 MG PO TABS: 1000.0000 mg | ORAL_TABLET | Freq: Four times a day (QID) | ORAL | Status: DC | PRN

## 2013-09-06 NOTE — Discharge Instructions (Signed)
°Emergency Department Resource Guide °1) Find a Doctor and Pay Out of Pocket °Although you won't have to find out who is covered by your insurance plan, it is a good idea to ask around and get recommendations. You will then need to call the office and see if the doctor you have chosen will accept you as a new patient and what types of options they offer for patients who are self-pay. Some doctors offer discounts or will set up payment plans for their patients who do not have insurance, but you will need to ask so you aren't surprised when you get to your appointment. ° °2) Contact Your Local Health Department °Not all health departments have doctors that can see patients for sick visits, but many do, so it is worth a call to see if yours does. If you don't know where your local health department is, you can check in your phone book. The CDC also has a tool to help you locate your state's health department, and many state websites also have listings of all of their local health departments. ° °3) Find a Walk-in Clinic °If your illness is not likely to be very severe or complicated, you may want to try a walk in clinic. These are popping up all over the country in pharmacies, drugstores, and shopping centers. They're usually staffed by nurse practitioners or physician assistants that have been trained to treat common illnesses and complaints. They're usually fairly quick and inexpensive. However, if you have serious medical issues or chronic medical problems, these are probably not your best option. ° °No Primary Care Doctor: °- Call Health Connect at  832-8000 - they can help you locate a primary care doctor that  accepts your insurance, provides certain services, etc. °- Physician Referral Service- 1-800-533-3463 ° °Chronic Pain Problems: °Organization         Address  Phone   Notes  °[facility]  (336) 297-2271 Patients need to be referred by their primary care doctor.  ° °Medication  Assistance: °Organization         Address  Phone   Notes  °[place] [facility] [address] °[place], [place] [address] (336) 641-8030 --Must be a resident of [place] °-- Must have NO insurance coverage whatsoever (no Medicaid/ Medicare, etc.) °-- The pt. MUST have a primary care doctor that directs their care regularly and follows them in the community °  °[facility]  (866) 331-1348   °[employer] Way  (888) 892-1162   ° °Agencies that provide inexpensive medical care: °Organization         Address  Phone   Notes  °[facility]  (336) 832-8035   °[facility]    (336) 832-7272   °[facility] [address] °[place], [place] [address] (336) 832-4777   °[facility] [address], °[place] (336) 271-4999   °[facility]    (336) 373-0678   °[facility]    (336) 272-1050   °[facility] ° [address], [place] Phone:  (336) 832-4444, Fax:  (336) 832-4440 Hours of Operation:  9 am - 6 pm, M-F.  Also accepts Medicaid/Medicare and self-pay.  °[facility] ° [address], [place] Phone: (336) 832-3150, Fax: (336) [phone]. Hours of Operation:  8:30 am - 5:30 pm, M-F.  Also accepts Medicaid and self-pay.  °[facility] [place] [address], [place] Phone: (336) 878-6027   °[facility] [address], [place], [place] (336)723-1848, Ext. 123 Mondays & Thursdays: 7-9 AM.  First 15 patients are seen on a first come, first serve basis. °  ° °Medicaid-accepting [place] Providers: ° °Organization         Address  Phone   Notes  °[facility] [address], [place] (336) 641-2100 Also accepts self-pay patients.  °[facility] [address], [place] ° (336) 856-9996   °[facility] [address], [place]  (336) 288-8857   °[facility] [address] [place] [address], [place] (336) 299-7000   °Veita Bland [address], [place]  ° (336) 373-1557 Only accepts [place] Access Medicaid patients after they have their name applied to their card.  ° °Self-Pay (no insurance) in [place]: ° °Organization         Address  Phone   Notes  °Sickle Cell Patients, [facility] [address], [place] (336) 832-1970   °[facility] [address], [place] (336) 832-4400   °[facility] ° [address], [place] (336) 992-4800   °[facility]/Dr. Osei-Bonsu ° [address] [place] [address], [place] or [address], [place] (336) 841-8500 Phone number for both [place] and [place] locations is the same.  °[facility] [address], [place] (336) 299-0000   °[facility] [address] [place] [address], [place] or [address] (336) 852-7530 °(336) 878-2260   °[facility] [address], [place] (336) 350-1642, phone; (336) 294-5005, fax Sees patients 1st and 3rd Saturday of every month.  Must not qualify for public or private insurance (i.e. Medicaid, Medicare, [place] Health Choice, Veterans' Benefits) • Household income should be no more than 200% of the poverty level •The clinic cannot treat you if you are pregnant or think you are pregnant • Sexually transmitted diseases are not treated at the clinic.  ° ° °Dental Care: °Organization         Address  Phone  Notes  °[place] [facility] [address], [place] (336) 641-6152 Accepts children up to age 21 who are enrolled in Medicaid or [place] Health Choice; pregnant women with a Medicaid card; and children who have applied for Medicaid or [place] Health Choice, but were declined, whose parents can pay a reduced fee at time of service.  °[place]  [facility] [place]  [address], [place] (336) 641-7733 Accepts children up to age 21 who are enrolled in Medicaid or [place] Health Choice; pregnant women with a Medicaid card; and children who have applied for Medicaid or [place] Health Choice, but were declined, whose parents can pay a reduced fee at time of service.  °[facility] ° [address], [place] (336) 641-4533 Patients are seen by appointment only. Walk-ins are not accepted. [facility] will see patients 18 years of age and older. °Monday - Tuesday (8am-5pm) °Most Wednesdays (8:30-5pm) °$30 per visit, cash only  °[facility] ° [address], [place] (336) 641-4533 Patients are seen by appointment only. Walk-ins are not accepted. [facility] will see patients 18 years of age and older. °One   Wednesday Evening (Monthly: Volunteer Based).  $30 per visit, cash only  °[facility]  (919) 537-3737 for adults; Children under age 4, call Graduate Pediatric Dentistry at (919) [phone]. Children aged 4-14, please call (919) [phone] to request a pediatric application. ° Dental services are provided in all areas of dental care including fillings, crowns and bridges, complete and partial dentures, implants, gum treatment, root canals, and extractions. Preventive care is also provided. Treatment is provided to both adults and children. °Patients are selected via a lottery and there is often a waiting list. °  °[facility] [address], °[place] ° (336) 763-8833 [email or web address] °  °[facility] [address], [place], [place] (336)723-1848, Ext. 123 Second and Fourth Thursday of each month, opens at 6:30 AM; Clinic ends at 9 AM.  Patients are seen on a first-come first-served basis, and a limited number are seen during each clinic.  ° °[facility] ° [address], [place], [place] (336) 723-7904    Eligibility Requirements °You must have lived in [place], [place], or [place] counties for at least the last three months. °  You cannot be eligible for state or federal sponsored healthcare insurance, including Veterans Administration, Medicaid, or Medicare. °  You generally cannot be eligible for healthcare insurance through your employer.  °  How to apply: °Eligibility screenings are held every Tuesday and Wednesday afternoon from 1:00 pm until 4:00 pm. You do not need an appointment for the interview!  °[facility] [address], [place], [place] 336-631-2330   °[facility]  336-342-8273   °[facility]  336-703-3100   °[facility]  336-570-6415   ° °Behavioral Health Resources in the Community: °Intensive Outpatient Programs °Organization         Address  Phone  Notes  °[facility] [address], [place], [place] 336-878-6098   °[facility] [address], [place], [place] 336-832-9800   °[facility] [address], [place], [place] ° 336-882-2125   °[facility] [address],  °[place], [place] 1-800-853-5163 or 336-641-4981   °Substance Abuse Resources °Organization         Address  Phone  Notes  °[facility]  336-882-2125   °[facility]  336-784-9470   °[facility]  336-285-9073   °[facility]  336-845-3988   °Residential & Outpatient Substance Abuse Program  1-800-659-3381   °Psychological Services °Organization         Address  Phone  Notes  °[facility]  336- 832-9600   °[facility]  336- 378-7881   °[facility] [address], [place] 1-800-853-5163 or 336-641-4981   ° °Mobile Crisis Teams °Organization         Address  Phone  Notes  °[facility], Mobile Crisis Care Unit  1-877-626-1772   °Assertive °[facility] ° [address]  [place], [place] 336-834-9664   °Sharon DeEsch [address] °[place] 336-554-5454   ° °Self-Help/Support Groups °Organization         Address  Phone             Notes  °[facility] of  - variety of support groups  336- 373-1402 Call for more information  °Narcotics Anonymous (NA), [facility] [address], °[place]  2 meetings at this location  ° °  Residential Treatment Programs Organization         Address  Phone  Notes  [facility] [address],    [place]  1-610-960-45401-413-201-8015   [facility]  [address], [place] [phone], [place], [place] [phone]   [facility] [address], [place] [phone](386)184-9054 Admissions: 8am-3pm M-F  [facility] [address]    [place], [place] [phone]   [facility] [address], [place], [place] 401-027-2536([phone])[phone]   [facility] [address]  [place], [place] [phone]   [facility] Intensive Outpatient [address], [place], [place] [phone]   [facility] ([facility] [address]  [place], [place] [phone] or [phone](229)[phone]   [facility] ([facility] [address] [place], [place] [phone] Accepts Medicaid  [facility] [address]  [place] [phone] Substance Abuse/Addiction Treatment   [facility] Resources Organization         Address  Phone  Notes  [facility]  (979) 530-6617(888) (740) 779-9959   Angie FavaJulie Brannon, PhD [address], [address] [place], [place]   [phone](336) [phone] or 636-560-9486(336) [phone]   [facility]   [address] [place], [place] [phone](336) [phone]   [facility] [address], [place], [place] (337) 060-2051(336) [phone] Insurance/Medicaid/sponsorship through [facility] [address]                                    [place], [place] 562-384-3219(336) [phone] Therapy/tele-psych/case    [facility] [address]   [place], [place] [phone](336) [phone]    Dr. Lolly MustacheArfeen  [phone](336) ([phone]   [facility] [place]  [employer] [facility] 1) [address], [place] 2) [address], [place] 3)  [address], [place] (313) 829-7903(336) [phone](336) [phone]  [phone](336) [phone]   [facility] Child Abuse Hotline 757-062-2747(336) 707-067-6244 or 2298157643(336) [phone] (After Hours)      Take the prescriptions as directed.  Apply moist heat or ice to the area(s) of discomfort, for 15 minutes at a time, several times per day for the next few days.  Do not fall asleep on a heating or ice pack.  Call your regular Neurosurgeon on Monday to schedule a follow up appointment in the next 2 days.  Return to the Emergency Department immediately if worsening.

## 2013-09-06 NOTE — ED Notes (Signed)
Known L4-L5 disc herniation and rupture, currently on medrol dose pack and scheduled for OR with dr Jeral Fruitbotero this next week. Tonight pain increased and also hasn't been able to eat or drink since taking prednisone

## 2013-09-06 NOTE — ED Notes (Signed)
Received report on pt, pt c/o lower back pain, states that he is currently taking steroid dose pack and the pain is not getting any better, pt is scheduled for surgery this Wednesday September 10 2013 for his back.

## 2013-09-06 NOTE — ED Notes (Signed)
Dr. McManus at bedside,  
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
## 2013-09-15 ENCOUNTER — Other Ambulatory Visit: Payer: Self-pay | Admitting: Neurosurgery

## 2013-09-15 DIAGNOSIS — M5416 Radiculopathy, lumbar region: Secondary | ICD-10-CM

## 2013-09-18 ENCOUNTER — Emergency Department (HOSPITAL_COMMUNITY)
Admission: EM | Admit: 2013-09-18 | Discharge: 2013-09-18 | Disposition: A | Discharge status: Home / Self Care | Payer: No Typology Code available for payment source | Attending: Emergency Medicine | Admitting: Emergency Medicine

## 2013-09-18 ENCOUNTER — Encounter (HOSPITAL_COMMUNITY): Payer: Self-pay | Admitting: Emergency Medicine

## 2013-09-18 DIAGNOSIS — G8929 Other chronic pain: Secondary | ICD-10-CM | POA: Insufficient documentation

## 2013-09-18 DIAGNOSIS — M545 Low back pain, unspecified: Secondary | ICD-10-CM | POA: Insufficient documentation

## 2013-09-18 DIAGNOSIS — M5106 Intervertebral disc disorders with myelopathy, lumbar region: Secondary | ICD-10-CM | POA: Insufficient documentation

## 2013-09-18 DIAGNOSIS — Z87828 Personal history of other (healed) physical injury and trauma: Secondary | ICD-10-CM | POA: Insufficient documentation

## 2013-09-18 DIAGNOSIS — F411 Generalized anxiety disorder: Secondary | ICD-10-CM | POA: Insufficient documentation

## 2013-09-18 DIAGNOSIS — Z79899 Other long term (current) drug therapy: Secondary | ICD-10-CM | POA: Insufficient documentation

## 2013-09-18 DIAGNOSIS — G40909 Epilepsy, unspecified, not intractable, without status epilepticus: Secondary | ICD-10-CM | POA: Insufficient documentation

## 2013-09-18 DIAGNOSIS — Z8709 Personal history of other diseases of the respiratory system: Secondary | ICD-10-CM | POA: Insufficient documentation

## 2013-09-18 DIAGNOSIS — F319 Bipolar disorder, unspecified: Secondary | ICD-10-CM | POA: Insufficient documentation

## 2013-09-18 DIAGNOSIS — IMO0002 Reserved for concepts with insufficient information to code with codable children: Secondary | ICD-10-CM | POA: Insufficient documentation

## 2013-09-18 DIAGNOSIS — I10 Essential (primary) hypertension: Secondary | ICD-10-CM | POA: Insufficient documentation

## 2013-09-18 DIAGNOSIS — Z87891 Personal history of nicotine dependence: Secondary | ICD-10-CM | POA: Insufficient documentation

## 2013-09-18 DIAGNOSIS — M5136 Other intervertebral disc degeneration, lumbar region: Secondary | ICD-10-CM

## 2013-09-18 MED ORDER — DEXAMETHASONE 4 MG PO TABS: ORAL_TABLET | ORAL | Status: DC

## 2013-09-18 MED ORDER — CYCLOBENZAPRINE HCL 10 MG PO TABS: 10.0000 mg | ORAL_TABLET | Freq: Three times a day (TID) | ORAL | Status: DC

## 2013-09-18 MED ORDER — DICLOFENAC SODIUM 75 MG PO TBEC: 75.0000 mg | DELAYED_RELEASE_TABLET | Freq: Two times a day (BID) | ORAL | Status: DC
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
## 2013-09-18 NOTE — Discharge Instructions (Signed)
Please use Decadron and diclofenac 2 times daily with food. Please use Flexeril 3 times daily to help with spasm in the lower back. Please keep a journal of your back pain and discomfort to give to your neurology specialist. Please keep your appointment for the MRI. Please apply heat to your lower back, and rest her back is much as possible. Flexeril may cause drowsiness, please use with caution. Degenerative Disk Disease Degenerative disk disease is a condition caused by the changes that occur in the cushions of the backbone (spinal disks) as you grow older. Spinal disks are soft and compressible disks located between the bones of the spine (vertebrae). They act like shock absorbers. Degenerative disk disease can affect the whole spine. However, the neck and lower back are most commonly affected. Many changes can occur in the spinal disks with aging, such as:  The spinal disks may dry and shrink.  Small tears may occur in the tough, outer covering of the disk (annulus).  The disk space may become smaller due to loss of water.  Abnormal growths in the bone (spurs) may occur. This can put pressure on the nerve roots exiting the spinal canal, causing pain.  The spinal canal may become narrowed. CAUSES  Degenerative disk disease is a condition caused by the changes that occur in the spinal disks with aging. The exact cause is not known, but there is a genetic basis for many patients. Degenerative changes can occur due to loss of fluid in the disk. This makes the disk thinner and reduces the space between the backbones. Small cracks can develop in the outer layer of the disk. This can lead to the breakdown of the disk. You are more likely to get degenerative disk disease if you are overweight. Smoking cigarettes and doing heavy work such as weightlifting can also increase your risk of this condition. Degenerative changes can start after a sudden injury. Growth of bone spurs can compress the nerve roots and  cause pain.  SYMPTOMS  The symptoms vary from person to person. Some people may have no pain, while others have severe pain. The pain may be so severe that it can limit your activities. The location of the pain depends on the part of your backbone that is affected. You will have neck or arm pain if a disk in the neck area is affected. You will have pain in your back, buttocks, or legs if a disk in the lower back is affected. The pain becomes worse while bending, reaching up, or with twisting movements. The pain may start gradually and then get worse as time passes. It may also start after a major or minor injury. You may feel numbness or tingling in the arms or legs.  DIAGNOSIS  Your caregiver will ask you about your symptoms and about activities or habits that may cause the pain. He or she may also ask about any injuries, diseases, or treatments you have had earlier. Your caregiver will examine you to check for the range of movement that is possible in the affected area, to check for strength in your extremities, and to check for sensation in the areas of the arms and legs supplied by different nerve roots. An X-ray of the spine may be taken. Your caregiver may suggest other imaging tests, such as magnetic resonance imaging (MRI), if needed.  TREATMENT  Treatment includes rest, modifying your activities, and applying ice and heat. Your caregiver may prescribe medicines to reduce your pain and may ask you  to do some exercises to strengthen your back. In some cases, you may need surgery. You and your caregiver will decide on the treatment that is best for you. HOME CARE INSTRUCTIONS   Follow proper lifting and walking techniques as advised by your caregiver.  Maintain good posture.  Exercise regularly as advised.  Perform relaxation exercises.  Change your sitting, standing, and sleeping habits as advised. Change positions frequently.  Lose weight as advised.  Stop smoking if you smoke.  Wear  supportive footwear. SEEK MEDICAL CARE IF:  Your pain does not go away within 1 to 4 weeks. SEEK IMMEDIATE MEDICAL CARE IF:   Your pain is severe.  You notice weakness in your arms, hands, or legs.  You begin to lose control of your bladder or bowel movements. MAKE SURE YOU:   Understand these instructions.  Will watch your condition.  Will get help right away if you are not doing well or get worse. Document Released: 11/20/2006 Document Revised: 04/17/2011 Document Reviewed: 05/27/2013 [facility] Patient Information 2015 [place], [place]. This information is not intended to replace advice given to you by your health care provider. Make sure you discuss any questions you have with your health care provider.

## 2013-09-18 NOTE — ED Notes (Signed)
Pt alert & oriented x4, stable gait. Patient given discharge instructions, paperwork & prescription(s). Patient  instructed to stop at the registration desk to finish any additional paperwork. Patient verbalized understanding. Pt left department w/ no further questions. 

## 2013-09-18 NOTE — ED Notes (Signed)
Pt reports chronic back pain and DDD. Pt reports was moving a couch about 4 weeks ago and reported "back pain flare-up." Pt reports was seen at time of injury but reports pain is not any better since that visit. nad noted. Pt reports taking otc medication with no relief.

## 2013-09-22 NOTE — ED Provider Notes (Signed)
Medical screening examination/treatment/procedure(s) were performed by non-physician practitioner and as supervising physician I was immediately available for consultation/collaboration.   EKG Interpretation None       Myliyah Rebuck, MD 09/22/13 1344 

## 2013-09-23 ENCOUNTER — Other Ambulatory Visit: Payer: Medicaid Other

## 2014-02-19 ENCOUNTER — Encounter (HOSPITAL_COMMUNITY): Payer: Self-pay | Admitting: Emergency Medicine

## 2014-02-19 ENCOUNTER — Emergency Department (HOSPITAL_COMMUNITY)
Admission: EM | Admit: 2014-02-19 | Discharge: 2014-02-19 | Disposition: A | Discharge status: Home / Self Care | Payer: No Typology Code available for payment source | Attending: Emergency Medicine | Admitting: Emergency Medicine

## 2014-02-19 ENCOUNTER — Emergency Department (HOSPITAL_COMMUNITY): Payer: No Typology Code available for payment source

## 2014-02-19 DIAGNOSIS — Z791 Long term (current) use of non-steroidal anti-inflammatories (NSAID): Secondary | ICD-10-CM | POA: Insufficient documentation

## 2014-02-19 DIAGNOSIS — Z79899 Other long term (current) drug therapy: Secondary | ICD-10-CM | POA: Insufficient documentation

## 2014-02-19 DIAGNOSIS — W11XXXA Fall on and from ladder, initial encounter: Secondary | ICD-10-CM | POA: Insufficient documentation

## 2014-02-19 DIAGNOSIS — Y998 Other external cause status: Secondary | ICD-10-CM | POA: Insufficient documentation

## 2014-02-19 DIAGNOSIS — Y9289 Other specified places as the place of occurrence of the external cause: Secondary | ICD-10-CM | POA: Insufficient documentation

## 2014-02-19 DIAGNOSIS — I10 Essential (primary) hypertension: Secondary | ICD-10-CM | POA: Insufficient documentation

## 2014-02-19 DIAGNOSIS — Z87891 Personal history of nicotine dependence: Secondary | ICD-10-CM | POA: Insufficient documentation

## 2014-02-19 DIAGNOSIS — S20212A Contusion of left front wall of thorax, initial encounter: Secondary | ICD-10-CM | POA: Insufficient documentation

## 2014-02-19 DIAGNOSIS — S199XXA Unspecified injury of neck, initial encounter: Secondary | ICD-10-CM | POA: Insufficient documentation

## 2014-02-19 DIAGNOSIS — F419 Anxiety disorder, unspecified: Secondary | ICD-10-CM | POA: Insufficient documentation

## 2014-02-19 DIAGNOSIS — R0602 Shortness of breath: Secondary | ICD-10-CM

## 2014-02-19 DIAGNOSIS — G40909 Epilepsy, unspecified, not intractable, without status epilepticus: Secondary | ICD-10-CM | POA: Insufficient documentation

## 2014-02-19 DIAGNOSIS — F319 Bipolar disorder, unspecified: Secondary | ICD-10-CM | POA: Insufficient documentation

## 2014-02-19 DIAGNOSIS — S0990XA Unspecified injury of head, initial encounter: Secondary | ICD-10-CM | POA: Insufficient documentation

## 2014-02-19 DIAGNOSIS — G8929 Other chronic pain: Secondary | ICD-10-CM | POA: Insufficient documentation

## 2014-02-19 DIAGNOSIS — Z8709 Personal history of other diseases of the respiratory system: Secondary | ICD-10-CM | POA: Insufficient documentation

## 2014-02-19 DIAGNOSIS — Z87828 Personal history of other (healed) physical injury and trauma: Secondary | ICD-10-CM | POA: Insufficient documentation

## 2014-02-19 DIAGNOSIS — Y9389 Activity, other specified: Secondary | ICD-10-CM | POA: Insufficient documentation

## 2014-02-19 DIAGNOSIS — R000 Tachycardia, unspecified: Secondary | ICD-10-CM | POA: Insufficient documentation

## 2014-02-19 LAB — TROPONIN I: Troponin I: <0.03 ng/mL (ref <0.031)

## 2014-02-19 LAB — COMPREHENSIVE METABOLIC PANEL
ALBUMIN: 4.1 g/dL (ref 3.5–5.2)
ALT: 21 U/L (ref 0–53)
AST: 19 U/L (ref 0–37)
Alkaline Phosphatase: 55 U/L (ref 39–117)
Anion gap: 9 (ref 5–15)
BILIRUBIN TOTAL: 0.5 mg/dL (ref 0.3–1.2)
BUN: 18 mg/dL (ref 6–23)
CO2: 28 mmol/L (ref 19–32)
Calcium: 9.2 mg/dL (ref 8.4–10.5)
Chloride: 104 mEq/L (ref 96–112)
Creatinine, Ser: 0.94 mg/dL (ref 0.50–1.35)
GFR calc non Af Amer: >90 mL/min (ref >90)
GLUCOSE: 127 mg/dL — AB (ref 70–99)
POTASSIUM: 3.5 mmol/L (ref 3.5–5.1)
Sodium: 141 mmol/L (ref 135–145)
Total Protein: 6.8 g/dL (ref 6.0–8.3)

## 2014-02-19 LAB — CBC WITH DIFFERENTIAL/PLATELET
BASOS ABS: 0 10*3/uL (ref 0.0–0.1)
Basophils Relative: 0 % (ref 0–1)
Eosinophils Absolute: 0.4 10*3/uL (ref 0.0–0.7)
Eosinophils Relative: 4 % (ref 0–5)
HEMATOCRIT: 40 % (ref 39.0–52.0)
Hemoglobin: 12.8 g/dL — ABNORMAL LOW (ref 13.0–17.0)
Lymphocytes Relative: 44 % (ref 12–46)
Lymphs Abs: 4 10*3/uL (ref 0.7–4.0)
MCH: 27.2 pg (ref 26.0–34.0)
MCHC: 32 g/dL (ref 30.0–36.0)
MCV: 84.9 fL (ref 78.0–100.0)
MONO ABS: 0.8 10*3/uL (ref 0.1–1.0)
Monocytes Relative: 9 % (ref 3–12)
NEUTROS ABS: 3.9 10*3/uL (ref 1.7–7.7)
Neutrophils Relative %: 43 % (ref 43–77)
Platelets: 263 10*3/uL (ref 150–400)
RBC: 4.71 MIL/uL (ref 4.22–5.81)
RDW: 15.2 % (ref 11.5–15.5)
WBC: 9.2 10*3/uL (ref 4.0–10.5)

## 2014-02-19 MED ORDER — ONDANSETRON HCL 4 MG/2ML IJ SOLN
4.0000 mg | Freq: Once | INTRAMUSCULAR | Status: AC
  Administered 2014-02-19: 4 mg via INTRAVENOUS
  Filled 2014-02-19: qty 2

## 2014-02-19 MED ORDER — SODIUM CHLORIDE 0.9 % IV SOLN
Freq: Once | INTRAVENOUS | Status: AC
  Administered 2014-02-19: 16:00:00 via INTRAVENOUS

## 2014-02-19 MED ORDER — HYDROMORPHONE HCL 1 MG/ML IJ SOLN
1.0000 mg | Freq: Once | INTRAMUSCULAR | Status: AC
  Administered 2014-02-19: 1 mg via INTRAVENOUS
  Filled 2014-02-19: qty 1

## 2014-02-19 MED ORDER — OXYCODONE HCL 5 MG PO TABS: 5.0000 mg | ORAL_TABLET | ORAL | Status: AC | PRN

## 2014-02-19 NOTE — ED Notes (Signed)
Patient with no complaints at this time. Respirations even and unlabored. Skin warm/dry. Discharge instructions reviewed with patient at this time. Patient given opportunity to voice concerns/ask questions. IV removed per policy and band-aid applied to site. Patient discharged at this time and left Emergency Department with steady gait.  
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
## 2014-02-19 NOTE — ED Notes (Signed)
Pt has hx of collapsed lung x 2,  Fell 10 ft off ladder yesterday, sore but did not feel like he need treatment, today had popping feeling in side and same feeling as when collapsed lung, SOB, tightness in chest,  Abrasion and bruising to left side of face and rib area

## 2014-02-19 NOTE — Discharge Instructions (Signed)

## 2015-03-15 IMAGING — CR DG CHEST 1V PORT
1 series · 1 of 1 positions shown · non-contrast
Comparison: 03/14/2013 chest/rib radiographs

CLINICAL DATA: Shortness of breath since yesterday. Current smoker.

EXAM:
PORTABLE CHEST - 1 VIEW

[portable]
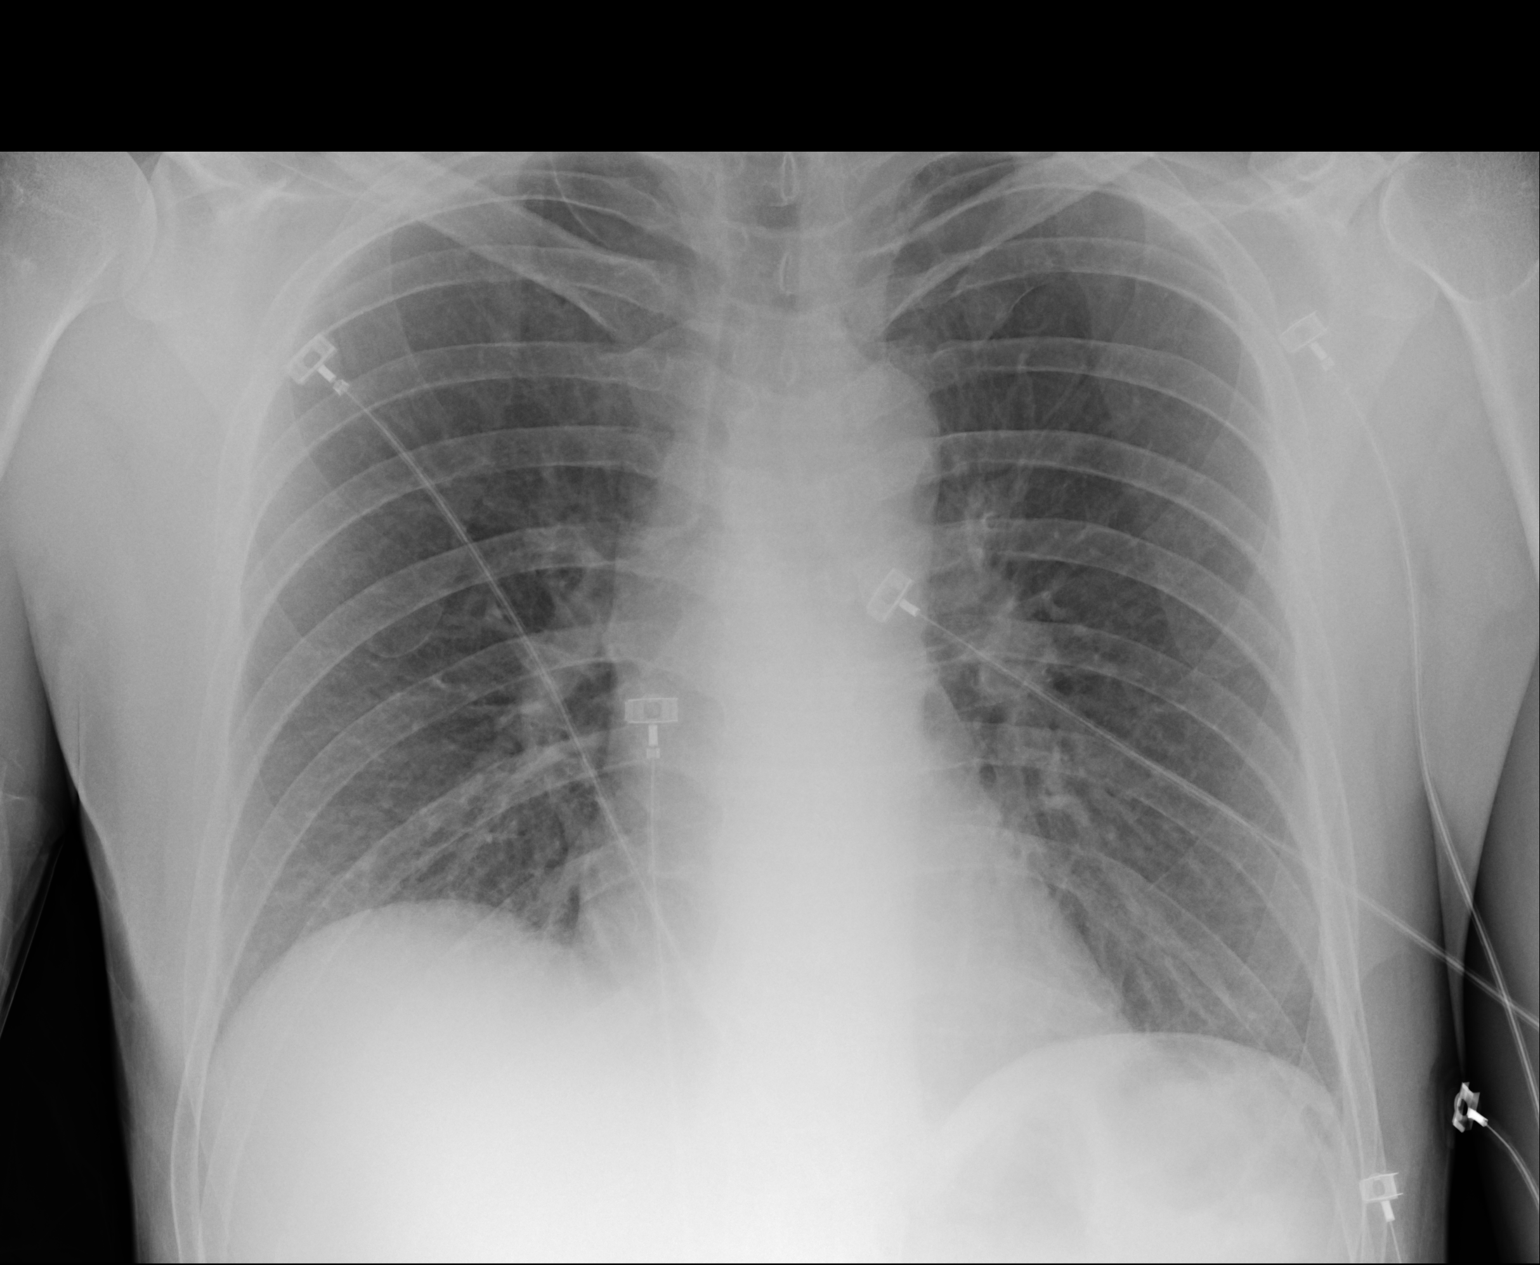

[1 of 1 positions shown; findings below may reference images not displayed]

FINDINGS: The cardiomediastinal silhouette is within normal limits. There is
mild elevation of the right hemidiaphragm. The patient has taken a
slightly shallower inspiration than on the prior study, there is
mild crowding of the bronchovascular markings in the lung bases. No
segmental airspace consolidation, pulmonary edema, pleural effusion,
or pneumothorax is identified. No acute osseous abnormality is seen.
IMPRESSION: Shallower lung inflation with minimal bibasilar atelectasis.
